# Patient Record
Sex: Male | Born: 1975
Health system: Southern US, Community
[De-identification: ages and names within clinical notes are randomized; demographics above are authoritative.]

## PROBLEM LIST (undated history)

## (undated) DIAGNOSIS — F32A Depression, unspecified: Secondary | ICD-10-CM

## (undated) DIAGNOSIS — N289 Disorder of kidney and ureter, unspecified: Secondary | ICD-10-CM

## (undated) DIAGNOSIS — K4090 Unilateral inguinal hernia, without obstruction or gangrene, not specified as recurrent: Secondary | ICD-10-CM

## (undated) DIAGNOSIS — Z973 Presence of spectacles and contact lenses: Secondary | ICD-10-CM

## (undated) DIAGNOSIS — N200 Calculus of kidney: Secondary | ICD-10-CM

## (undated) HISTORY — DX: Unilateral inguinal hernia, without obstruction or gangrene, not specified as recurrent: K40.90

## (undated) HISTORY — PX: WISDOM TOOTH EXTRACTION: SHX21

---

## 2003-05-01 ENCOUNTER — Emergency Department (HOSPITAL_COMMUNITY): Admission: EM | Admit: 2003-05-01 | Discharge: 2003-05-01 | Payer: Self-pay | Admitting: Family Medicine

## 2003-11-26 ENCOUNTER — Emergency Department (HOSPITAL_COMMUNITY): Admission: EM | Admit: 2003-11-26 | Discharge: 2003-11-26 | Payer: Self-pay | Admitting: *Deleted

## 2004-07-06 ENCOUNTER — Emergency Department (HOSPITAL_COMMUNITY): Admission: EM | Admit: 2004-07-06 | Discharge: 2004-07-06 | Payer: Self-pay | Admitting: Family Medicine

## 2008-11-02 ENCOUNTER — Encounter: Admission: RE | Admit: 2008-11-02 | Discharge: 2008-11-02 | Payer: Self-pay | Admitting: Internal Medicine

## 2010-12-30 ENCOUNTER — Telehealth: Payer: Self-pay | Admitting: Internal Medicine

## 2010-12-30 NOTE — Telephone Encounter (Signed)
Pt woke up this morning knot on the right leg pelvic area  Tinder to touch  Has appointment wed 11/14 @ 3:15 Dr walker diagnosed him with an equinal hernia in feb of this year

## 2010-12-30 NOTE — Telephone Encounter (Signed)
Left mess to call office back.  X 2

## 2011-01-02 ENCOUNTER — Ambulatory Visit: Payer: Self-pay | Admitting: Internal Medicine

## 2011-01-03 NOTE — Telephone Encounter (Signed)
Left pt detailed VM that he should keep apt tomorrow and if symptoms were severe or he had any questions to call office asap.

## 2011-01-04 ENCOUNTER — Encounter: Payer: Self-pay | Admitting: Internal Medicine

## 2011-01-04 ENCOUNTER — Other Ambulatory Visit: Payer: Self-pay | Admitting: Internal Medicine

## 2011-01-04 ENCOUNTER — Ambulatory Visit (INDEPENDENT_AMBULATORY_CARE_PROVIDER_SITE_OTHER): Payer: 59 | Admitting: Internal Medicine

## 2011-01-04 VITALS — BP 108/68 | HR 71 | Temp 98.2°F | Wt 169.0 lb

## 2011-01-04 DIAGNOSIS — L0291 Cutaneous abscess, unspecified: Secondary | ICD-10-CM

## 2011-01-04 DIAGNOSIS — K4090 Unilateral inguinal hernia, without obstruction or gangrene, not specified as recurrent: Secondary | ICD-10-CM

## 2011-01-04 DIAGNOSIS — L0390 Cellulitis, unspecified: Secondary | ICD-10-CM

## 2011-01-04 DIAGNOSIS — Z23 Encounter for immunization: Secondary | ICD-10-CM

## 2011-01-04 MED ORDER — SULFAMETHOXAZOLE-TMP DS 800-160 MG PO TABS: 1.0000 | ORAL_TABLET | Freq: Two times a day (BID) | ORAL | Status: AC

## 2011-01-04 NOTE — Progress Notes (Signed)
  Subjective:    Patient ID: Andrew Vazquez, male    DOB: 1975/03/13, 35 y.o.   MRN: [id number]  HPI 34YO male presents for acute visit c/o bulging right mass inguinal area noted about 1 week ago. At same time, noted abscess in right medial upper thigh. Has had similar abscesses in past, associated with ingrown hair.  Drained mass using tweezers and scizors at home.  Reports drainage of purulent material and blood. Persistent pain at site after 1 week. Denies fever, chills.  No other skin lesions.  Also notes left sided bulging area in groin which we had evaluated several months ago and diagnosed as hernia.  Has not yet seen surgeon for this. No pain at site. Notes some intermittent swelling and change in size of both testicles. No testicular masses noted.    Review of Systems  Constitutional: Negative for fever, chills, activity change, appetite change, fatigue and unexpected weight change.  Eyes: Negative for visual disturbance.  Respiratory: Negative for cough and shortness of breath.   Cardiovascular: Negative for chest pain, palpitations and leg swelling.  Gastrointestinal: Negative for abdominal pain and abdominal distention.  Genitourinary: Negative for dysuria, urgency and difficulty urinating.  Musculoskeletal: Negative for arthralgias and gait problem.  Skin: Positive for wound. Negative for color change and rash.  Hematological: Positive for adenopathy.   BP 108/68  Pulse 71  Temp(Src) 98.2 F (36.8 C) (Oral)  Wt 169 lb (76.658 kg)  SpO2 98%     Objective:   Physical Exam  Constitutional: He is oriented to person, place, and time. He appears well-developed and well-nourished. No distress.  HENT:  Head: Normocephalic and atraumatic.  Right Ear: External ear normal.  Left Ear: External ear normal.  Nose: Nose normal.  Mouth/Throat: Oropharynx is clear and moist. No oropharyngeal exudate.  Eyes: Conjunctivae and EOM are normal. Pupils are equal, round, and reactive to  light. No scleral icterus.  Neck: Normal range of motion. Neck supple. No thyromegaly present.  Pulmonary/Chest: Effort normal.  Abdominal: A hernia is present. Hernia confirmed positive in the left inguinal area.  Genitourinary:     Musculoskeletal: Normal range of motion. He exhibits no edema.  Lymphadenopathy:       Right: Inguinal adenopathy present.  Neurological: He is alert and oriented to person, place, and time. No cranial nerve deficit. Coordination normal.  Skin: Skin is warm and dry. No rash noted. He is not diaphoretic. There is erythema. No pallor.  Psychiatric: He has a normal mood and affect. His behavior is normal. Judgment and thought content normal.          Assessment & Plan:  1. Left inguinal hernia - Persistent. Recommended surgical evaluation for repair. Will set up with Dr. Michela Pitcher.  2. Right inguinal adenopathy - Suspect related to abscess/folliculitis. Will treat with bactrim and monitor for resolution.  3. Right anterior upper thigh abscess - Open and draining. Culture sent from wound today. Will start empiric Bactrim. Follow up if not resolving by end of week.

## 2011-01-07 LAB — CULTURE, ROUTINE-ABSCESS
Gram Stain: NONE SEEN
Gram Stain: NONE SEEN
Gram Stain: NONE SEEN
Organism ID, Bacteria: NO GROWTH

## 2011-01-20 ENCOUNTER — Ambulatory Visit (INDEPENDENT_AMBULATORY_CARE_PROVIDER_SITE_OTHER): Payer: 59 | Admitting: Surgery

## 2011-01-25 ENCOUNTER — Encounter: Payer: Self-pay | Admitting: Internal Medicine

## 2011-02-08 ENCOUNTER — Ambulatory Visit: Payer: Self-pay | Admitting: Surgery

## 2011-02-08 HISTORY — PX: LAPAROSCOPIC INGUINAL HERNIA REPAIR: SUR788

## 2011-04-10 ENCOUNTER — Ambulatory Visit (INDEPENDENT_AMBULATORY_CARE_PROVIDER_SITE_OTHER): Payer: 59 | Admitting: Internal Medicine

## 2011-04-10 ENCOUNTER — Encounter: Payer: Self-pay | Admitting: Internal Medicine

## 2011-04-10 DIAGNOSIS — K4090 Unilateral inguinal hernia, without obstruction or gangrene, not specified as recurrent: Secondary | ICD-10-CM | POA: Insufficient documentation

## 2011-04-10 DIAGNOSIS — Z720 Tobacco use: Secondary | ICD-10-CM

## 2011-04-10 DIAGNOSIS — F17200 Nicotine dependence, unspecified, uncomplicated: Secondary | ICD-10-CM

## 2011-04-10 NOTE — Assessment & Plan Note (Signed)
Strongly encourage smoking cessation. We discussed techniques to help with smoking cessation including the use of Chantix or Wellbutrin. Patient will continue to think about this he will call or e-mail if he is interested in starting one of these medications.

## 2011-04-10 NOTE — Progress Notes (Signed)
  Subjective:    Patient ID: Andrew Vazquez, male    DOB: [birth year], 36 y.o.   MRN: [id number]  HPI 36 year old male status post left inguinal hernia repair presents for followup. He reports he tolerated surgery well. He denies any complications after surgery. He denies any recurrent abdominal pain except for mild pain which he attributes to scar tissue and mesh. He denies fever or chills. He has been cleared by his surgeon to return to work.   No outpatient encounter prescriptions on file as of 04/10/2011.    Review of Systems  Constitutional: Negative for fever, chills, activity change, appetite change, fatigue and unexpected weight change.  Eyes: Negative for visual disturbance.  Respiratory: Negative for cough and shortness of breath.   Cardiovascular: Negative for chest pain, palpitations and leg swelling.  Gastrointestinal: Negative for abdominal pain and abdominal distention.  Genitourinary: Negative for dysuria, urgency and difficulty urinating.  Musculoskeletal: Negative for arthralgias and gait problem.  Skin: Negative for color change and rash.  Hematological: Negative for adenopathy.  Psychiatric/Behavioral: Negative for sleep disturbance and dysphoric mood. The patient is not nervous/anxious.    BP 110/66  Pulse 81  Temp(Src) 98.3 F (36.8 C) (Oral)  Ht 5\' 10"  (1.778 m)  Wt 172 lb (78.019 kg)  BMI 24.68 kg/m2  SpO2 99%     Objective:   Physical Exam  Constitutional: He is oriented to person, place, and time. He appears well-developed and well-nourished. No distress.  HENT:  Head: Normocephalic and atraumatic.  Right Ear: External ear normal.  Left Ear: External ear normal.  Nose: Nose normal.  Mouth/Throat: Oropharynx is clear and moist. No oropharyngeal exudate.  Eyes: Conjunctivae and EOM are normal. Pupils are equal, round, and reactive to light. Right eye exhibits no discharge. Left eye exhibits no discharge. No scleral icterus.  Neck: Normal range of  motion. Neck supple. No tracheal deviation present. No thyromegaly present.  Cardiovascular: Normal rate, regular rhythm and normal heart sounds.  Exam reveals no gallop and no friction rub.   No murmur heard. Pulmonary/Chest: Effort normal and breath sounds normal. No respiratory distress. He has no wheezes. He has no rales. He exhibits no tenderness.  Abdominal: Soft. Bowel sounds are normal. He exhibits no distension and no mass. There is no tenderness. There is no rebound and no guarding.    Musculoskeletal: Normal range of motion. He exhibits no edema.  Lymphadenopathy:    He has no cervical adenopathy.  Neurological: He is alert and oriented to person, place, and time. No cranial nerve deficit. Coordination normal.  Skin: Skin is warm and dry. No rash noted. He is not diaphoretic. No erythema. No pallor.  Psychiatric: He has a normal mood and affect. His behavior is normal. Judgment and thought content normal.          Assessment & Plan:

## 2011-04-10 NOTE — Assessment & Plan Note (Signed)
Status post hernia repair. Surgical site is well-healed. Patient has been released by surgeon to go back to work. 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

## 2011-08-02 ENCOUNTER — Telehealth: Payer: Self-pay | Admitting: Internal Medicine

## 2011-08-02 NOTE — Telephone Encounter (Signed)
EMERGENT:  Caller: Andrew Vazquez/Patient; PCP: Ronna Polio; CB#: [phone];  Call regarding Pain Above Hernia Surgery 6 months ago.;  Intermittent pain, especially if moves or coughs, is located L abdomen at level of umbilicus. Pain rated 6 out of 10. Onset: 07/31/11.  Afebrile. Started out as generalized abdominal pain, now worse on L side.   Last BM 08/01/11.  No vomiting or nausea.  At work. Advised to see MD now for generalized abdominal pain that progressed to localized pain and unable to carry out normal activities per Abdominal Pain Guideline. No appts remain in office; Info noted and sent to MD.  Please call pt at above work #.

## 2011-08-02 NOTE — Telephone Encounter (Signed)
Patient advised as instructed via telephone, he will go to [facility] since he is in [facility] now.

## 2011-08-02 NOTE — Telephone Encounter (Signed)
Will need to go to ED as may have obstruction of bowel given recent hernia surgery.

## 2011-08-02 NOTE — Telephone Encounter (Signed)
Left message on cell phone voicemail and on voicemail at work for patient to return call.

## 2011-08-03 ENCOUNTER — Ambulatory Visit (INDEPENDENT_AMBULATORY_CARE_PROVIDER_SITE_OTHER): Payer: 59 | Admitting: Internal Medicine

## 2011-08-03 ENCOUNTER — Encounter: Payer: Self-pay | Admitting: Internal Medicine

## 2011-08-03 VITALS — BP 110/70 | HR 70 | Temp 98.7°F | Ht 70.0 in | Wt 165.5 lb

## 2011-08-03 DIAGNOSIS — H6690 Otitis media, unspecified, unspecified ear: Secondary | ICD-10-CM

## 2011-08-03 DIAGNOSIS — R1032 Left lower quadrant pain: Secondary | ICD-10-CM

## 2011-08-03 DIAGNOSIS — H6691 Otitis media, unspecified, right ear: Secondary | ICD-10-CM | POA: Insufficient documentation

## 2011-08-03 MED ORDER — ANTIPYRINE-BENZOCAINE 5.4-1.4 % OT SOLN: 3.0000 [drp] | OTIC | Status: AC | PRN

## 2011-08-03 MED ORDER — LEVOFLOXACIN 750 MG PO TABS: 750.0000 mg | ORAL_TABLET | Freq: Every day | ORAL | Status: AC

## 2011-08-03 NOTE — Assessment & Plan Note (Signed)
Symptoms have resolved. Suspect secondary to muscular strain. Will continue to monitor. He will call if symptoms recur.

## 2011-08-03 NOTE — Progress Notes (Signed)
Subjective:    Patient ID: Andrew Vazquez, male    DOB: December 17, 1975, 36 y.o.   MRN: [id number]  HPI 36 year old male presents for acute visit complaining of intermittent episodes of left lower quadrant abdominal pain yesterday. This has now resolved. During that time, he did not have any nausea, vomiting, diarrhea, fever, chills. He notes that he has been increasing his physical activity and doing abdominal exercises. Pain is completely resolved without any intervention.  He is also concerned today about sudden onset of right ear pain this morning. He notes he has had an upper respiratory infection with sinus drainage, and occasional cough over the last couple of weeks. He denies any fever or chills. He notes that his son is ill with similar symptoms.  Outpatient Encounter Prescriptions as of 08/03/2011  Medication Sig Dispense Refill  . antipyrine-benzocaine (AURALGAN) otic solution Place 3 drops into the right ear every 2 (two) hours as needed for pain.  10 mL  0  . levofloxacin (LEVAQUIN) 750 MG tablet Take 1 tablet (750 mg total) by mouth daily.  7 tablet  0    Review of Systems  Constitutional: Negative for fever, chills, fatigue and unexpected weight change.  HENT: Positive for ear pain, congestion, rhinorrhea, postnasal drip and sinus pressure. Negative for hearing loss, nosebleeds, sore throat, facial swelling, sneezing, drooling, mouth sores, trouble swallowing, neck pain, neck stiffness, dental problem, voice change, tinnitus and ear discharge.   Eyes: Negative for photophobia, pain, discharge, redness, itching and visual disturbance.  Respiratory: Positive for cough. Negative for apnea, choking, chest tightness, shortness of breath and wheezing.   Cardiovascular: Negative for chest pain, palpitations and leg swelling.  Gastrointestinal: Positive for abdominal pain. Negative for nausea, vomiting, diarrhea, constipation, blood in stool, abdominal distention, anal bleeding and rectal  pain.  Genitourinary: Negative for dysuria, frequency, hematuria, flank pain, decreased urine volume, scrotal swelling, difficulty urinating and testicular pain.  Musculoskeletal: Negative for myalgias, back pain and arthralgias.  Skin: Negative for color change, rash and wound.   BP 110/70  Pulse 70  Temp 98.7 F (37.1 C) (Oral)  Ht 5\' 10"  (1.778 m)  Wt 165 lb 8 oz (75.07 kg)  BMI 23.75 kg/m2  SpO2 97%     Objective:   Physical Exam  Constitutional: He is oriented to person, place, and time. He appears well-developed and well-nourished. No distress.  HENT:  Head: Normocephalic and atraumatic.  Right Ear: External ear normal. Tympanic membrane is injected, erythematous and bulging. A middle ear effusion is present.  Left Ear: Tympanic membrane and external ear normal.  Nose: Nose normal.  Mouth/Throat: Oropharynx is clear and moist. No oropharyngeal exudate.  Eyes: Conjunctivae and EOM are normal. Pupils are equal, round, and reactive to light. Right eye exhibits no discharge. Left eye exhibits no discharge. No scleral icterus.  Neck: Normal range of motion. Neck supple. No tracheal deviation present. No thyromegaly present.  Cardiovascular: Normal rate, regular rhythm and normal heart sounds.  Exam reveals no gallop and no friction rub.   No murmur heard. Pulmonary/Chest: Effort normal and breath sounds normal. No respiratory distress. He has no wheezes. He has no rales. He exhibits no tenderness.  Abdominal: Soft. Bowel sounds are normal. He exhibits no distension and no mass. There is no tenderness. There is no rebound and no guarding.  Musculoskeletal: Normal range of motion. He exhibits no edema.  Lymphadenopathy:    He has no cervical adenopathy.  Neurological: He is alert and oriented to person, place,  and time. No cranial nerve deficit. Coordination normal.  Skin: Skin is warm and dry. No rash noted. He is not diaphoretic. No erythema. No pallor.  Psychiatric: He has a  normal mood and affect. His behavior is normal. Judgment and thought content normal.          Assessment & Plan:

## 2011-08-03 NOTE — Assessment & Plan Note (Signed)
Exam consistent with right otitis media. Will treat with Levaquin and use Auralgan and ibuprofen as needed for pain. Patient will call if symptoms are not improving over the next 24 hours. He will followup in one month for recheck.

## 2011-08-31 ENCOUNTER — Ambulatory Visit: Payer: 59 | Admitting: Internal Medicine

## 2012-03-15 ENCOUNTER — Telehealth: Payer: Self-pay | Admitting: Internal Medicine

## 2012-03-15 NOTE — Telephone Encounter (Signed)
Agree. Most likely viral infection, but can be seen Saturday at [facility].

## 2012-03-15 NOTE — Telephone Encounter (Signed)
Pt called and message left notifying him of this information.

## 2012-03-15 NOTE — Telephone Encounter (Signed)
Forward to Dr. Walker 

## 2012-03-15 NOTE — Telephone Encounter (Signed)
Patient Information:  Caller Name: Andrew Vazquez  Phone: [phone]  Patient: Andrew Vazquez, Andrew Vazquez  Gender: Male  DOB: [birth year]  Age: 37 Years  PCP: Ronna Polio (Adults only)  Office Follow Up:  Does the office need to follow up with this patient?: No  Instructions For The Office: N/A   Symptoms  Reason For Call & Symptoms: Started with a sore throat on 1/21 and worsened.  Has a fever today; aching all over.  Reviewed Health History In EMR: Yes  Reviewed Medications In EMR: Yes  Reviewed Allergies In EMR: Yes  Reviewed Surgeries / Procedures: Yes  Date of Onset of Symptoms: 03/13/2012  Treatments Tried: Tylenol Sore Throat/Cold sx  Treatments Tried Worked: No  Guideline(s) Used:  Influenza - Seasonal  Sore Throat  Disposition Per Guideline:   See Today in Office  Reason For Disposition Reached:   Severe sore throat pain  Advice Given:  N/A  No appointments available today.  He refused to go to an UC.  RN will schedule an appointment at [facility] for Saturday when the appointments are opened.

## 2012-03-16 ENCOUNTER — Encounter: Payer: Self-pay | Admitting: Internal Medicine

## 2012-03-16 ENCOUNTER — Ambulatory Visit (INDEPENDENT_AMBULATORY_CARE_PROVIDER_SITE_OTHER): Payer: 59 | Admitting: Internal Medicine

## 2012-03-16 VITALS — BP 100/62 | Temp 97.6°F | Wt 168.0 lb

## 2012-03-16 DIAGNOSIS — J020 Streptococcal pharyngitis: Secondary | ICD-10-CM

## 2012-03-16 LAB — POCT RAPID STREP A (OFFICE): Rapid Strep A Screen: NEGATIVE

## 2012-03-16 MED ORDER — PHENYLEPH-PROMETHAZINE-COD 5-6.25-10 MG/5ML PO SYRP: 5.0000 mL | ORAL_SOLUTION | Freq: Three times a day (TID) | ORAL | Status: DC | PRN

## 2012-03-16 MED ORDER — LEVOFLOXACIN 500 MG PO TABS: 500.0000 mg | ORAL_TABLET | Freq: Every day | ORAL | Status: DC

## 2012-03-16 NOTE — Progress Notes (Signed)
HPI  Pt presents to the clinic today with c/o sore throat and cough x 4 days. The worst part is the sore throat and dry cough. He does not produce any sputum. He was running fevers yesterday. He has tried Delsym and cough drops and nothing seems to help. The cough is worse at night. He has not had much sleep in 3 nights. He does have sick contacts.  Review of Systems      Past Medical History  Diagnosis Date  . Hernia, inguinal, unilateral     Family History  Problem Relation Age of Onset  . Cancer Father 72    Colon    History   Social History  . Marital Status: Married    Spouse Name: N/A    Number of Children: N/A  . Years of Education: N/A   Occupational History  . Not on file.   Social History Main Topics  . Smoking status: Current Every Day Smoker -- 1.0 packs/day  . Smokeless tobacco: Not on file  . Alcohol Use: Not on file  . Drug Use: Not on file  . Sexually Active: Not on file   Other Topics Concern  . Not on file   Social History Narrative  . No narrative on file    Allergies  Allergen Reactions  . Penicillins Rash     Constitutional: Positive headache, fatigue and fever. Denies abrupt weight changes.  HEENT:  Positive sore throat. Denies eye redness, eye pain, pressure behind the eyes, facial pain, nasal congestion, ear pain, ringing in the ears, wax buildup, runny nose or bloody nose. Respiratory: Positive cough. Denies difficulty breathing or shortness of breath.  Cardiovascular: Denies chest pain, chest tightness, palpitations or swelling in the hands or feet.   No other specific complaints in a complete review of systems (except as listed in HPI above).  Objective:   BP 100/62  Temp 97.6 F (36.4 C) (Oral)  Wt 168 lb (76.204 kg) Wt Readings from Last 3 Encounters:  03/16/12 168 lb (76.204 kg)  08/03/11 165 lb 8 oz (75.07 kg)  04/10/11 172 lb (78.019 kg)     General: Appears his stated age, well developed, well nourished in  NAD. HEENT: Head: normal shape and size; Eyes: sclera white, no icterus, conjunctiva pink, PERRLA and EOMs intact; Ears: Tm's gray and intact, normal light reflex; Nose: mucosa pink and moist, septum midline; Throat/Mouth: + PND. Teeth present, mucosa erythematous and moist, moderate exudate noted on bilateral tonsillar pillars, no lesions or ulcerations noted.  Neck: Moderate tonsiallar lymphadenopathy. Neck supple, trachea midline. No massses, lumps or thyromegaly present.  Cardiovascular: Normal rate and rhythm. S1,S2 noted.  No murmur, rubs or gallops noted. No JVD or BLE edema. No carotid bruits noted. Pulmonary/Chest: Normal effort and positive vesicular breath sounds. No respiratory distress. No wheezes, rales or ronchi noted.      Assessment & Plan:   Strep Pharyngitis, new onset with additional workup required:  Rapid strep test  Get some rest and drink plenty of water Do salt water gargles for the sore throat eRx for Levaquin x 7 days eRx for promethazine-codeine cough syrup  RTC as needed or if symptoms persist.

## 2012-03-16 NOTE — Patient Instructions (Signed)

## 2012-03-18 ENCOUNTER — Telehealth: Payer: Self-pay | Admitting: Internal Medicine

## 2012-03-18 NOTE — Telephone Encounter (Signed)
[facility] Triage Call Report Triage Record Num: [id number] Operator: Hyman Bower Patient Name: Andrew Vazquez Call Date & Time: 03/16/2012 1:47:11PM Patient Phone: [phone] PCP: Ronna Polio Patient Gender: Male PCP Fax : [phone] Patient DOB: [birth year] Practice Name: [facility] Reason for Call: Caller: [name]/Patient; PCP: Other; CB#: [phone]; Patient was seen by Nicki Reaper this morning and diagnosed with strep throat. Patient was able to get antibiotic filled, but is unable to get Phenyleph-Promethazine-Cod 5-6.25-10mg /43ml filled. Patient has tried to get this filled at several different pharmacies. Pharmacy tells the patient because the letters VC are on the prescription. Seems like this medication is not available. Called and spoke with Dr. Ermalene Searing, she wants to change the order to Guaifenesin with Codeine syrup 5/100 5-10mg  PO HS prn coughing. Called this into [facility] on [address]. Advised patient this was being changed from the previous prescription he was unable to get filled. Patient verbalized understanding. Protocol(s) Used: Office Note Recommended Outcome per Protocol: Information Noted and Sent to Office Reason for Outcome: Caller information to office

## 2012-03-18 NOTE — Telephone Encounter (Signed)
Patient seen 03/16/12 @ Saturday clinic

## 2012-03-22 ENCOUNTER — Telehealth: Payer: Self-pay | Admitting: Internal Medicine

## 2012-03-22 NOTE — Telephone Encounter (Signed)
Pt was scheduled for 03/26/12 but called and r/s to 04/02/12 because he needed an early appointment because of his work schedule and driving to [place].

## 2012-03-22 NOTE — Telephone Encounter (Signed)
Pt is calling concerning strep throat and he couldn't ever get the rx filled that the physician at [facility] had written out. He is asking that a nurse or Dr. Dan Humphreys call him if possible. He has had a bad reaction to strep medication and isn't sure what he should do.

## 2012-03-22 NOTE — Telephone Encounter (Signed)
Spoke with pt. Completed Levaquin. Now having sandpaper-like red rash over body. Description seems most consistent with strep infection. No recurrent fever, throat pain or other symptoms. Pt will email with picture of rash, as I am out of office later today. Will plan to use Benadryl 25-50mg  po q8hr prn itching and monitor rash for now. If symptoms worsening, will need to be seen over the weekend.

## 2012-03-25 ENCOUNTER — Telehealth: Payer: Self-pay | Admitting: Internal Medicine

## 2012-03-25 NOTE — Telephone Encounter (Signed)
Patient offered appointment today and he declined. Patient scheduled to be seen tomorrow (Tuesday0.

## 2012-03-25 NOTE — Telephone Encounter (Signed)
This pt is trying to get in for rash. Can we put him in with Raquel today?

## 2012-03-26 ENCOUNTER — Ambulatory Visit: Payer: 59 | Admitting: Internal Medicine

## 2012-03-26 ENCOUNTER — Encounter: Payer: Self-pay | Admitting: Internal Medicine

## 2012-03-26 ENCOUNTER — Ambulatory Visit (INDEPENDENT_AMBULATORY_CARE_PROVIDER_SITE_OTHER): Payer: 59 | Admitting: Internal Medicine

## 2012-03-26 VITALS — BP 112/62 | HR 67 | Temp 97.8°F | Ht 71.0 in | Wt 169.0 lb

## 2012-03-26 DIAGNOSIS — J020 Streptococcal pharyngitis: Secondary | ICD-10-CM

## 2012-03-26 DIAGNOSIS — A389 Scarlet fever, uncomplicated: Secondary | ICD-10-CM

## 2012-03-26 DIAGNOSIS — A388 Scarlet fever with other complications: Secondary | ICD-10-CM | POA: Insufficient documentation

## 2012-03-26 MED ORDER — PREDNISONE (PAK) 10 MG PO TABS: ORAL_TABLET | ORAL | Status: DC

## 2012-03-26 MED ORDER — CEFDINIR 300 MG PO CAPS: 300.0000 mg | ORAL_CAPSULE | Freq: Two times a day (BID) | ORAL | Status: DC

## 2012-03-26 NOTE — Assessment & Plan Note (Signed)
Symptoms of sore throat persistent after treatment with levaquin. Now with diffuse rash consistent with scarlet fever. Will send throat culture. Will treat with Cefdinir and Prednisone taper. Pt will call if recurrent fever, if rash worsening. Follow up 1 week.

## 2012-03-26 NOTE — Progress Notes (Signed)
  Subjective:    Patient ID: Andrew Vazquez, male    DOB: 09-Apr-1975, 37 y.o.   MRN: [id number]  HPI 37YO male presents for acute visit c/o rash x 5 days. Pt was seen in clinic 2 weeks ago for sore throat, diagnosed with strep throat (rapid test neg) and treated with Levaquin. His sore throat improved but did not completely resolve. Over the course of last week, he developed red, bumpy rash over his trunk and extremities. He denies any fever, chills. Notes mild sore throat. He notes that several times in the past, he has had this kind of rash after strep throat.  Outpatient Encounter Prescriptions as of 03/26/2012  Medication Sig Dispense Refill  . cefdinir (OMNICEF) 300 MG capsule Take 1 capsule (300 mg total) by mouth 2 (two) times daily.  20 capsule  0  . predniSONE (STERAPRED UNI-PAK) 10 MG tablet Take 60mg  day 1 then taper by 10mg  daily  21 tablet  0    Review of Systems  Constitutional: Negative for fever, chills, activity change and fatigue.  HENT: Positive for sore throat. Negative for hearing loss, ear pain, nosebleeds, congestion, rhinorrhea, sneezing, trouble swallowing, neck pain, neck stiffness, voice change, postnasal drip, sinus pressure, tinnitus and ear discharge.   Eyes: Negative for discharge, redness, itching and visual disturbance.  Respiratory: Negative for cough, chest tightness, shortness of breath, wheezing and stridor.   Cardiovascular: Negative for chest pain and leg swelling.  Musculoskeletal: Negative for myalgias and arthralgias.  Skin: Positive for color change and rash.  Neurological: Negative for dizziness, facial asymmetry and headaches.  Psychiatric/Behavioral: Negative for sleep disturbance.       Objective:   Physical Exam  Constitutional: He is oriented to person, place, and time. He appears well-developed and well-nourished. No distress.  HENT:  Head: Normocephalic and atraumatic.  Right Ear: External ear normal.  Left Ear: External ear normal.   Nose: Nose normal.  Mouth/Throat: Posterior oropharyngeal erythema present. No oropharyngeal exudate.  Eyes: Conjunctivae normal and EOM are normal. Pupils are equal, round, and reactive to light. Right eye exhibits no discharge. Left eye exhibits no discharge. No scleral icterus.  Neck: Normal range of motion. Neck supple. No tracheal deviation present. No thyromegaly present.  Cardiovascular: Normal rate, regular rhythm and normal heart sounds.  Exam reveals no gallop and no friction rub.   No murmur heard. Pulmonary/Chest: Effort normal and breath sounds normal. No respiratory distress. He has no wheezes. He has no rales. He exhibits no tenderness.  Musculoskeletal: Normal range of motion. He exhibits no edema.  Lymphadenopathy:    He has no cervical adenopathy.  Neurological: He is alert and oriented to person, place, and time. No cranial nerve deficit. Coordination normal.  Skin: Skin is warm and dry. Rash (over trunk and extremities) noted. Rash is maculopapular. He is not diaphoretic. There is erythema. No pallor.  Psychiatric: He has a normal mood and affect. His behavior is normal. Judgment and thought content normal.          Assessment & Plan:

## 2012-03-27 ENCOUNTER — Ambulatory Visit: Payer: 59 | Admitting: Internal Medicine

## 2012-03-28 LAB — CULTURE, GROUP A STREP

## 2012-03-29 ENCOUNTER — Telehealth: Payer: Self-pay | Admitting: Internal Medicine

## 2012-03-29 NOTE — Telephone Encounter (Signed)
Spoke with patient and offered several appts with Dr.Walker and he declined them all because of his work schdule, pt scheduled with Raquel on Tuesday @ 8am

## 2012-03-29 NOTE — Telephone Encounter (Signed)
Pt calling to get instructions for MyChart.  Pt also wants to let Dr. Dan Humphreys know that the new Rx and steroids on received Tuesday have not helped and the rash is no better.  Please advise.

## 2012-03-29 NOTE — Telephone Encounter (Signed)
The instructions for MyChart are in the packet I gave him. If rash no better, he should be seen.

## 2012-03-29 NOTE — Telephone Encounter (Signed)
We could offer him an appointment with another provider? Or at [facility]? He was treated appropriately for the strep infection. The rash may persist. We could also set up an appointment with ENT, as he may need eval for repeated strep infections/colonization

## 2012-03-29 NOTE — Telephone Encounter (Signed)
Spoke with patient who was irritated, pt states this rash is no better and there is a problem. I tried scheduling pt today at 1:45 but pt stated he works in [place] and can't come then wanted something as late as possible. i advised pt you where full and tried to schedule on Monday which there are 2 openings and again this doesn't;t work for his schedule, he wants "real early or real late" pt states he can't just take off whenever the Dr. Luciana Axe. Please advise

## 2012-03-31 ENCOUNTER — Encounter: Payer: Self-pay | Admitting: Internal Medicine

## 2012-04-02 ENCOUNTER — Ambulatory Visit: Payer: 59 | Admitting: Internal Medicine

## 2012-04-02 ENCOUNTER — Ambulatory Visit: Payer: 59 | Admitting: Adult Health

## 2012-04-06 ENCOUNTER — Other Ambulatory Visit: Payer: Self-pay

## 2012-08-15 ENCOUNTER — Encounter: Payer: Self-pay | Admitting: Adult Health

## 2012-08-15 ENCOUNTER — Ambulatory Visit (INDEPENDENT_AMBULATORY_CARE_PROVIDER_SITE_OTHER): Payer: 59 | Admitting: Adult Health

## 2012-08-15 VITALS — BP 106/60 | HR 67 | Temp 98.3°F | Resp 12 | Wt 175.0 lb

## 2012-08-15 DIAGNOSIS — R319 Hematuria, unspecified: Secondary | ICD-10-CM

## 2012-08-15 LAB — POCT URINALYSIS DIPSTICK
Glucose, UA: NEGATIVE
Leukocytes, UA: NEGATIVE
Spec Grav, UA: 1.025
Urobilinogen, UA: 0.2
pH, UA: 6

## 2012-08-15 MED ORDER — LEVOFLOXACIN 500 MG PO TABS: 500.0000 mg | ORAL_TABLET | Freq: Every day | ORAL | Status: DC

## 2012-08-15 NOTE — Assessment & Plan Note (Addendum)
Hematuria x2 events. UTI vs. nephrolithiasis vs. Prostatitis. UA dipstick shows blood, negative nitrites or leukocytes. Send urine for culture. Start Levaquin 500 mg daily x10 days. Push fluids, mainly water. Avoid bladder irritants such as alcohol, tea and colas. If cultures negative will do additional workup; consider referral to urology.

## 2012-08-15 NOTE — Progress Notes (Signed)
  Subjective:    Patient ID: Andrew Vazquez, male    DOB: 1975-05-01, 37 y.o.   MRN: [id number]  HPI  Patient is a healthy 37 year old male who presents to clinic with hematuria x 2 episodes. First time was Tuesday evening and then again Wednesday morning. He has not seen any more blood since. He experienced mild lower back pain which has subsided. Patient denies fever, chills, suprapubic tenderness, dysuria. Patient reports working out regularly. He denies taking any supplements. Patient is married. He is sexually active and reports a monogamous relationship.   Past Medical History  Diagnosis Date  . Hernia, inguinal, unilateral     Family History  Problem Relation Age of Onset  . Cancer Father 19    Colon    History   Social History  . Marital Status: Married    Spouse Name: N/A    Number of Children: N/A  . Years of Education: N/A   Occupational History  . Not on file.   Social History Main Topics  . Smoking status: Current Every Day Smoker -- 1.00 packs/day  . Smokeless tobacco: Not on file  . Alcohol Use: Not on file  . Drug Use: Not on file  . Sexually Active: Not on file   Other Topics Concern  . Not on file   Social History Narrative  . No narrative on file    Review of Systems  Endocrine: Negative.   Genitourinary: Positive for hematuria and flank pain. Negative for dysuria, urgency, frequency, discharge, scrotal swelling, difficulty urinating and testicular pain.  Musculoskeletal: Positive for back pain.  Skin: Negative.   Allergic/Immunologic: Negative.   Neurological: Negative.   Hematological: Negative.   Psychiatric/Behavioral: Negative.   All other systems reviewed and are negative.    BP 106/60  Pulse 67  Temp(Src) 98.3 F (36.8 C) (Oral)  Resp 12  Wt 175 lb (79.379 kg)  BMI 24.42 kg/m2  SpO2 98%    Objective:   Physical Exam  Constitutional: He is oriented to person, place, and time. He appears well-developed and well-nourished. No  distress.  Cardiovascular: Normal rate and regular rhythm.   Pulmonary/Chest: Effort normal. No respiratory distress.  Abdominal: Soft. He exhibits no distension and no mass. There is no tenderness. There is no rebound and no guarding.  Neurological: He is alert and oriented to person, place, and time.  Skin: Skin is warm and dry.  Psychiatric: He has a normal mood and affect. His behavior is normal. Judgment and thought content normal.       Assessment & Plan:

## 2012-08-22 ENCOUNTER — Telehealth: Payer: Self-pay | Admitting: Internal Medicine

## 2012-08-22 NOTE — Telephone Encounter (Signed)
I put the order in. Can you call to add?

## 2012-08-22 NOTE — Telephone Encounter (Signed)
Left message for pt with advice. 

## 2012-08-22 NOTE — Telephone Encounter (Signed)
If he develops a fever, chills or unrelieved pain he needs to go to the ED.

## 2012-08-22 NOTE — Telephone Encounter (Signed)
I have not received his culture back ??

## 2012-08-22 NOTE — Telephone Encounter (Signed)
You never send me a message to add a culture, the only times I automatically send a urine for culture is when there is leukocytes in the urine

## 2012-08-22 NOTE — Telephone Encounter (Signed)
Cant be added, past the 24 hours

## 2012-08-22 NOTE — Telephone Encounter (Signed)
Please tell patient to return to clinic for a repeat urinalysis after he has completed the antibiotic. We will check if there is still blood. There was a lab mixup and his urine was not sent for culture.

## 2012-08-22 NOTE — Telephone Encounter (Signed)
Spoke with pt, was woken up last night with sharp right flank pain, pain has subsided to just tender to touch. He has taken Ibuprofen and drinking lots of fluids if possible kidney stone. Any other advice for him?

## 2012-08-22 NOTE — Telephone Encounter (Signed)
Patient wants his lab results from his urine.

## 2012-09-05 ENCOUNTER — Other Ambulatory Visit: Payer: Self-pay | Admitting: Adult Health

## 2012-09-05 ENCOUNTER — Ambulatory Visit (INDEPENDENT_AMBULATORY_CARE_PROVIDER_SITE_OTHER): Payer: 59 | Admitting: Adult Health

## 2012-09-05 ENCOUNTER — Telehealth: Payer: Self-pay | Admitting: *Deleted

## 2012-09-05 ENCOUNTER — Encounter: Payer: Self-pay | Admitting: Adult Health

## 2012-09-05 ENCOUNTER — Ambulatory Visit
Admission: RE | Admit: 2012-09-05 | Discharge: 2012-09-05 | Disposition: A | Discharge status: Home / Self Care | Payer: 59 | Source: Ambulatory Visit | Attending: Adult Health | Admitting: Adult Health

## 2012-09-05 VITALS — BP 100/62 | HR 60 | Temp 98.1°F | Resp 12 | Wt 175.5 lb

## 2012-09-05 DIAGNOSIS — R109 Unspecified abdominal pain: Secondary | ICD-10-CM

## 2012-09-05 DIAGNOSIS — N390 Urinary tract infection, site not specified: Secondary | ICD-10-CM

## 2012-09-05 DIAGNOSIS — R319 Hematuria, unspecified: Secondary | ICD-10-CM

## 2012-09-05 DIAGNOSIS — N1330 Unspecified hydronephrosis: Secondary | ICD-10-CM

## 2012-09-05 LAB — URINALYSIS, ROUTINE W REFLEX MICROSCOPIC
Bilirubin Urine: NEGATIVE
Ketones, ur: NEGATIVE
Leukocytes, UA: NEGATIVE
Nitrite: NEGATIVE
Specific Gravity, Urine: 1.02 (ref 1.000–1.030)
Total Protein, Urine: NEGATIVE
Urine Glucose: NEGATIVE
Urobilinogen, UA: 0.2 (ref 0.0–1.0)
pH: 6 (ref 5.0–8.0)

## 2012-09-05 LAB — POCT URINALYSIS DIPSTICK
Bilirubin, UA: NEGATIVE
Glucose, UA: NEGATIVE
Ketones, UA: NEGATIVE
Leukocytes, UA: NEGATIVE
Nitrite, UA: NEGATIVE
Protein, UA: NEGATIVE
Spec Grav, UA: 1.02
Urobilinogen, UA: 0.2
pH, UA: 6

## 2012-09-05 LAB — CREATININE, SERUM: Creat: 1.3 mg/dL (ref 0.50–1.35)

## 2012-09-05 NOTE — Addendum Note (Signed)
Addended by: Montine Circle D on: 09/05/2012 12:29 PM   Modules accepted: Orders

## 2012-09-05 NOTE — Telephone Encounter (Signed)
Spoke with patient. Sent for stat creatinine. Has appt with [facility] tomorrow. Spoke with Asher Muir, triage nurse, at [facility].

## 2012-09-05 NOTE — Progress Notes (Signed)
  Subjective:    Patient ID: Andrew Vazquez, male    DOB: Sep 20, 1975, 37 y.o.   MRN: [id number]  HPI  Patient is a healthy 37 year old male who presents to clinic for followup of hematuria. Patient was seen in clinic 08/15/2012 and was treated with Levaquin 500 mg daily x10 days for urinary tract infection. Patient reports still having right flank pain. Pain was so severe during the night that it woke him. He denies fever, chills, superpubic tenderness or dysuria. He has been taking high dose ibuprofen (600 mg) to help with the pain.   Review of Systems  Constitutional: Negative for fever and chills.  Gastrointestinal: Negative for nausea and vomiting.  Genitourinary: Positive for flank pain. Negative for dysuria and hematuria.  Neurological: Negative.   Psychiatric/Behavioral: Negative.        Objective:   Physical Exam  Constitutional: He is oriented to person, place, and time. He appears well-developed and well-nourished. No distress.  Cardiovascular: Normal rate and regular rhythm.   Pulmonary/Chest: Effort normal. No respiratory distress.  Abdominal: Soft. There is no tenderness.  Genitourinary:  Right costovertebral tenderness  Musculoskeletal: Normal range of motion.  Neurological: He is alert and oriented to person, place, and time.  Skin: Skin is warm and dry.  Psychiatric: He has a normal mood and affect. His behavior is normal. Judgment and thought content normal.          Assessment & Plan:

## 2012-09-05 NOTE — Assessment & Plan Note (Signed)
Patient is status post 10 days of Levaquin treated for UTI. Still having right flank pain. UA dipstick shows small amount of blood. Send urine for culture and urinalysis. CT scan of abdomen and pelvis to rule out kidney stone. Offered a prescription for stronger pain medicine such as hydrocodone. Patient reports that he has hydrocodone at home from previous event and will take as needed.

## 2012-09-05 NOTE — Telephone Encounter (Signed)
Moderate hydro-nephrosis caused by 6X2X6 mm  Right UPJ caliculus 2 non obstructive Renal left lower pole renal calculi 3 mm each. Appendix unremarkable .

## 2012-09-07 LAB — URINE CULTURE
Colony Count: NO GROWTH
Organism ID, Bacteria: NO GROWTH

## 2012-12-26 ENCOUNTER — Other Ambulatory Visit: Payer: Self-pay

## 2013-12-05 ENCOUNTER — Other Ambulatory Visit: Payer: Self-pay

## 2014-02-03 ENCOUNTER — Telehealth: Payer: Self-pay | Admitting: *Deleted

## 2014-02-03 NOTE — Telephone Encounter (Signed)
Andrew Vazquez:  02/02/14 9:18 am --Caller states has like a warm sensation in his pelvic area and testicle same area where he had a hernia, has lasted over the last 10 days. Denies doing any heavy lifting or any specific event that may have preceded a "potential" injury     Pt is scheduled for an appt on 02/05/14

## 2014-02-05 ENCOUNTER — Encounter: Payer: Self-pay | Admitting: Internal Medicine

## 2014-02-05 ENCOUNTER — Ambulatory Visit (INDEPENDENT_AMBULATORY_CARE_PROVIDER_SITE_OTHER): Payer: 59 | Admitting: Internal Medicine

## 2014-02-05 VITALS — BP 107/63 | HR 62 | Temp 98.1°F | Ht 71.0 in | Wt 182.0 lb

## 2014-02-05 DIAGNOSIS — R1032 Left lower quadrant pain: Secondary | ICD-10-CM

## 2014-02-05 DIAGNOSIS — Z800 Family history of malignant neoplasm of digestive organs: Secondary | ICD-10-CM | POA: Insufficient documentation

## 2014-02-05 LAB — CBC WITH DIFFERENTIAL/PLATELET
Basophils Absolute: 0 10*3/uL (ref 0.0–0.1)
Basophils Relative: 0.5 % (ref 0.0–3.0)
Eosinophils Absolute: 0.2 10*3/uL (ref 0.0–0.7)
Eosinophils Relative: 2.4 % (ref 0.0–5.0)
HCT: 48.2 % (ref 39.0–52.0)
Hemoglobin: 16.1 g/dL (ref 13.0–17.0)
Lymphocytes Relative: 37.3 % (ref 12.0–46.0)
Lymphs Abs: 3 10*3/uL (ref 0.7–4.0)
MCHC: 33.5 g/dL (ref 30.0–36.0)
MCV: 95.1 fl (ref 78.0–100.0)
Monocytes Absolute: 0.6 10*3/uL (ref 0.1–1.0)
Monocytes Relative: 7.8 % (ref 3.0–12.0)
Neutro Abs: 4.2 10*3/uL (ref 1.4–7.7)
Neutrophils Relative %: 52 % (ref 43.0–77.0)
Platelets: 266 10*3/uL (ref 150.0–400.0)
RBC: 5.07 Mil/uL (ref 4.22–5.81)
RDW: 13.1 % (ref 11.5–15.5)
WBC: 8.1 10*3/uL (ref 4.0–10.5)

## 2014-02-05 LAB — COMPREHENSIVE METABOLIC PANEL
ALT: 24 U/L (ref 0–53)
AST: 20 U/L (ref 0–37)
Albumin: 4.3 g/dL (ref 3.5–5.2)
Alkaline Phosphatase: 86 U/L (ref 39–117)
BUN: 14 mg/dL (ref 6–23)
CO2: 29 mEq/L (ref 19–32)
Calcium: 9.9 mg/dL (ref 8.4–10.5)
Chloride: 101 mEq/L (ref 96–112)
Creatinine, Ser: 1.1 mg/dL (ref 0.4–1.5)
GFR: 82.66 mL/min (ref >60.00)
Glucose, Bld: 72 mg/dL (ref 70–99)
Potassium: 4.2 mEq/L (ref 3.5–5.1)
Sodium: 138 mEq/L (ref 135–145)
Total Bilirubin: 0.6 mg/dL (ref 0.2–1.2)
Total Protein: 7.3 g/dL (ref 6.0–8.3)

## 2014-02-05 NOTE — Progress Notes (Signed)
Subjective:    Patient ID: Andrew Vazquez, male    DOB: 09/06/1975, 38 y.o.   MRN: [id number]  HPI 38YO male presents for acute visit.  Left pelvic pain - had bilateral inguinal hernia repair 3 years ago.  Over last 2-3 weeks, having intermittent left groin burning pain. No trauma or injury to area. No change in bowel habits. No blood in stool. Pain comes and goes, but is worse with strenuous activity. Have some occasional left testicular discomfort at times.   Past medical, surgical, family and social history per today's encounter.  Review of Systems  Constitutional: Negative for fever, chills, activity change, appetite change, fatigue and unexpected weight change.  Eyes: Negative for visual disturbance.  Respiratory: Negative for cough and shortness of breath.   Cardiovascular: Negative for chest pain, palpitations and leg swelling.  Gastrointestinal: Positive for abdominal pain. Negative for nausea, vomiting, diarrhea, constipation, blood in stool, abdominal distention and anal bleeding.  Genitourinary: Positive for testicular pain. Negative for dysuria, urgency, frequency, hematuria, flank pain, discharge, penile swelling, scrotal swelling, difficulty urinating, genital sores and penile pain.  Musculoskeletal: Negative for arthralgias and gait problem.  Skin: Negative for color change and rash.  Hematological: Negative for adenopathy.  Psychiatric/Behavioral: Negative for sleep disturbance and dysphoric mood. The patient is not nervous/anxious.        Objective:    BP 107/63 mmHg  Pulse 62  Temp(Src) 98.1 F (36.7 C) (Oral)  Ht 5\' 11"  (1.803 m)  Wt 182 lb (82.555 kg)  BMI 25.40 kg/m2  SpO2 98% Physical Exam  Constitutional: He is oriented to person, place, and time. He appears well-developed and well-nourished. No distress.  HENT:  Head: Normocephalic and atraumatic.  Right Ear: External ear normal.  Left Ear: External ear normal.  Nose: Nose normal.  Mouth/Throat:  Oropharynx is clear and moist.  Eyes: Conjunctivae and EOM are normal. Pupils are equal, round, and reactive to light. Right eye exhibits no discharge. Left eye exhibits no discharge. No scleral icterus.  Neck: Normal range of motion. Neck supple. No tracheal deviation present. No thyromegaly present.  Cardiovascular: Normal rate, regular rhythm and normal heart sounds.  Exam reveals no gallop and no friction rub.   No murmur heard. Pulmonary/Chest: Effort normal and breath sounds normal. No accessory muscle usage. No tachypnea. No respiratory distress. He has no decreased breath sounds. He has no wheezes. He has no rhonchi. He has no rales. He exhibits no tenderness.  Abdominal: Soft. Bowel sounds are normal. He exhibits no distension and no mass. There is tenderness (mild left inguinal area). There is no rebound and no guarding.  Genitourinary: Testes normal.    Right testis shows no mass, no swelling and no tenderness. Left testis shows no mass, no swelling and no tenderness. Circumcised. No penile erythema or penile tenderness. No discharge found.  Musculoskeletal: Normal range of motion. He exhibits no edema.  Lymphadenopathy:    He has no cervical adenopathy.  Neurological: He is alert and oriented to person, place, and time. No cranial nerve deficit. Coordination normal.  Skin: Skin is warm and dry. No rash noted. He is not diaphoretic. No erythema. No pallor.  Psychiatric: He has a normal mood and affect. His behavior is normal. Judgment and thought content normal.          Assessment & Plan:   Problem List Items Addressed This Visit      Unprioritized   Abdominal pain, left lower quadrant - Primary  Left groin and testicular pain. S/p bilateral inguinal hernia repair in 2012. Exam remarkable for possible defect medially, noted on Valsalva. Will set up CT abdomen/pelvis for further evaluation.    Relevant Orders      CT Abdomen Pelvis W Contrast      CBC with Differential        Comprehensive metabolic panel      PSA, total and free   Family history of colon cancer       Return in about 1 week (around 02/12/2014) for Recheck.

## 2014-02-05 NOTE — Patient Instructions (Signed)
We will set up a CT scan of the abdomen for further evaluation of left groin pain.  Follow up in 1-2 weeks.

## 2014-02-05 NOTE — Progress Notes (Signed)
Pre visit review using our clinic review tool, if applicable. No additional management support is needed unless otherwise documented below in the visit note. 

## 2014-02-05 NOTE — Assessment & Plan Note (Signed)
Left groin and testicular pain. S/p bilateral inguinal hernia repair in 2012. Exam remarkable for possible defect medially, noted on Valsalva. Will set up CT abdomen/pelvis for further evaluation.

## 2014-02-06 ENCOUNTER — Telehealth: Payer: Self-pay | Admitting: Internal Medicine

## 2014-02-06 LAB — PSA, TOTAL AND FREE
PSA, Free Pct: 26 % (ref >25)
PSA, Free: 0.15 ng/mL
PSA: 0.58 ng/mL (ref <=4.00)

## 2014-02-06 NOTE — Telephone Encounter (Signed)
emmi emailed °

## 2014-02-11 ENCOUNTER — Ambulatory Visit: Payer: Self-pay | Admitting: Internal Medicine

## 2014-02-12 ENCOUNTER — Ambulatory Visit (INDEPENDENT_AMBULATORY_CARE_PROVIDER_SITE_OTHER): Payer: 59 | Admitting: Internal Medicine

## 2014-02-12 ENCOUNTER — Encounter: Payer: Self-pay | Admitting: Internal Medicine

## 2014-02-12 ENCOUNTER — Telehealth: Payer: Self-pay

## 2014-02-12 VITALS — BP 98/65 | HR 70 | Temp 97.6°F | Ht 71.0 in | Wt 181.8 lb

## 2014-02-12 DIAGNOSIS — R1032 Left lower quadrant pain: Secondary | ICD-10-CM

## 2014-02-12 DIAGNOSIS — N200 Calculus of kidney: Secondary | ICD-10-CM

## 2014-02-12 MED ORDER — TAMSULOSIN HCL 0.4 MG PO CAPS: 0.4000 mg | ORAL_CAPSULE | Freq: Every day | ORAL | Status: DC

## 2014-02-12 NOTE — Telephone Encounter (Signed)
OK. CT from [facility] was completely normal. Nothing to explain groin pain.  Can you ask if he is still having pain?  If yes, then we should see him today.

## 2014-02-12 NOTE — Progress Notes (Signed)
Pre visit review using our clinic review tool, if applicable. No additional management support is needed unless otherwise documented below in the visit note. 

## 2014-02-12 NOTE — Assessment & Plan Note (Signed)
Will restart Flomax to help with passage of bilateral nephrolithiasis, as this worked well for him in the past. He understands risks of this medication.

## 2014-02-12 NOTE — Patient Instructions (Signed)
Follow up in 4 weeks or sooner as needed.

## 2014-02-12 NOTE — Progress Notes (Signed)
Subjective:    Patient ID: Andrew Vazquez, male    DOB: 1976/01/07, 38 y.o.   MRN: [id number]  HPI  38YO male presents for follow up.  Last seen 12/17 with left lower abdominal/groin pain. Concern for recurrent inguinal hernia, and CT abdomen was performed. No abnormalities seen on imaging except for bilateral small kidney stones that were non-obstructive. Continues to have intermittent "fullness or pressure" in his testicles bilaterally. No penile pain. No urinary symptoms. No swelling or mass noted. He notes that symptoms have been present to some extent for >5 years.    Past medical, surgical, family and social history per today's encounter.  Review of Systems  Constitutional: Negative for fever, chills, activity change, appetite change, fatigue and unexpected weight change.  Eyes: Negative for visual disturbance.  Respiratory: Negative for cough and shortness of breath.   Cardiovascular: Negative for chest pain, palpitations and leg swelling.  Gastrointestinal: Negative for nausea, vomiting, abdominal pain, diarrhea, constipation, abdominal distention and rectal pain.  Genitourinary: Positive for testicular pain. Negative for dysuria, urgency, frequency, hematuria, discharge, penile swelling, scrotal swelling, difficulty urinating, genital sores and penile pain.  Musculoskeletal: Negative for arthralgias and gait problem.  Skin: Negative for color change and rash.  Hematological: Negative for adenopathy.  Psychiatric/Behavioral: Negative for sleep disturbance and dysphoric mood. The patient is not nervous/anxious.        Objective:    BP 98/65 mmHg  Pulse 70  Temp(Src) 97.6 F (36.4 C) (Oral)  Ht 5\' 11"  (1.803 m)  Wt 181 lb 12 oz (82.441 kg)  BMI 25.36 kg/m2  SpO2 98% Physical Exam  Constitutional: He is oriented to person, place, and time. He appears well-developed and well-nourished. No distress.  HENT:  Head: Normocephalic and atraumatic.  Right Ear: External ear  normal.  Left Ear: External ear normal.  Nose: Nose normal.  Mouth/Throat: Oropharynx is clear and moist.  Eyes: Conjunctivae and EOM are normal. Pupils are equal, round, and reactive to light. Right eye exhibits no discharge. Left eye exhibits no discharge. No scleral icterus.  Neck: Normal range of motion. Neck supple. No tracheal deviation present. No thyromegaly present.  Cardiovascular: Normal rate, regular rhythm and normal heart sounds.  Exam reveals no gallop and no friction rub.   No murmur heard. Pulmonary/Chest: Effort normal and breath sounds normal. No accessory muscle usage. No tachypnea. No respiratory distress. He has no decreased breath sounds. He has no wheezes. He has no rhonchi. He has no rales. He exhibits no tenderness.  Musculoskeletal: Normal range of motion. He exhibits no edema.  Lymphadenopathy:    He has no cervical adenopathy.  Neurological: He is alert and oriented to person, place, and time. No cranial nerve deficit. Coordination normal.  Skin: Skin is warm and dry. No rash noted. He is not diaphoretic. No erythema. No pallor.  Psychiatric: He has a normal mood and affect. His behavior is normal. Judgment and thought content normal.          Assessment & Plan:   Problem List Items Addressed This Visit      Unprioritized   Abdominal pain, left lower quadrant - Primary    Left groin pain now described more as testicular fullness or pressure. Exam normal. CT abdomen and pelvis was normal with no abnormality of previous inguinal hernia repair. We discussed potential causes. Reviewed scrotal US from 2011 which showed mild hydrocele bilaterally. Discussed possible repeat of [place] to evaluate for hydrocele and torsion. He would like to hold  off for now. Will monitor. Discussed referral back to Urology, but he declines. Follow up in 4 weeks and prn.    Nephrolithiasis    Will restart Flomax to help with passage of bilateral nephrolithiasis, as this worked well for  him in the past. He understands risks of this medication.    Relevant Medications      tamsulosin (FLOMAX) capsule 0.4 mg       Return in about 4 weeks (around 03/12/2014).

## 2014-02-12 NOTE — Assessment & Plan Note (Signed)
Left groin pain now described more as testicular fullness or pressure. Exam normal. CT abdomen and pelvis was normal with no abnormality of previous inguinal hernia repair. We discussed potential causes. Reviewed scrotal US from 2011 which showed mild hydrocele bilaterally. Discussed possible repeat of [place] to evaluate for hydrocele and torsion. He would like to hold off for now. Will monitor. Discussed referral back to Urology, but he declines. Follow up in 4 weeks and prn.

## 2014-02-12 NOTE — Telephone Encounter (Signed)
Patient wanted to keep apt due to still having "brusing" feeling.

## 2014-02-12 NOTE — Telephone Encounter (Signed)
The patient stated he had his ct done at [facility] off [address].

## 2014-02-16 ENCOUNTER — Telehealth: Payer: Self-pay | Admitting: Internal Medicine

## 2014-02-16 NOTE — Telephone Encounter (Signed)
Please see below.

## 2014-02-16 NOTE — Telephone Encounter (Signed)
Patient said every since he came in to see you on 02/12/14 he has been experiencing great stomach and lower back pain.  On a scale of 1-10 for pain, he is experiencing a 5 in pain. He would like for you to give him a call to discuss what has been going on with his stomach.

## 2014-02-16 NOTE — Telephone Encounter (Signed)
Pt states that pains are different from pains he had prior to CT, feels like shooting pain comes and goes, possibly gas from the drink he drank for the CT.  Offered him an appt on 03/05/14 but he was not happy, asking for something sooner.

## 2014-02-16 NOTE — Telephone Encounter (Signed)
His CT scan was normal with no findings to explain stomach pain. We should see him back in a visit to evaluate

## 2014-02-17 NOTE — Telephone Encounter (Signed)
OK. Needs to be seen then. 11:30am tomorrow if open

## 2014-02-17 NOTE — Telephone Encounter (Signed)
Pt states that he tried some Gas-X the other day, seemed to help, But last night pt states that he moved a certain way and pain was worse and different.

## 2014-02-17 NOTE — Telephone Encounter (Signed)
Notified pt and put pt on the schedule

## 2014-02-17 NOTE — Telephone Encounter (Signed)
If this seems like gas pains, has he tried Mylicon? We should try this. If no improvement, we can work him in today.

## 2014-02-18 ENCOUNTER — Encounter: Payer: Self-pay | Admitting: Internal Medicine

## 2014-02-18 ENCOUNTER — Ambulatory Visit (INDEPENDENT_AMBULATORY_CARE_PROVIDER_SITE_OTHER): Payer: 59 | Admitting: Internal Medicine

## 2014-02-18 VITALS — BP 124/69 | HR 71 | Temp 98.1°F | Ht 71.0 in | Wt 182.0 lb

## 2014-02-18 DIAGNOSIS — R1013 Epigastric pain: Secondary | ICD-10-CM

## 2014-02-18 MED ORDER — PANTOPRAZOLE SODIUM 40 MG PO TBEC: 40.0000 mg | DELAYED_RELEASE_TABLET | Freq: Every day | ORAL | Status: DC

## 2014-02-18 NOTE — Assessment & Plan Note (Signed)
Epigastric abdominal pain. Tender over epigastric area on exam. Concerning for gastritis or ulcer. Will start Pantoprazole 40mg  daily. Check H. Pylori. Follow up 2 weeks. If no improvement, then referral for EGD.

## 2014-02-18 NOTE — Progress Notes (Signed)
   Subjective:    Patient ID: Andrew Vazquez, male    DOB: 1976-01-23, 38 y.o.   MRN: [id number]  HPI 38YO male presents for acute visit.  Seen on 12/17 and 12/24 with left lower abdominal pain and testicular pressure. CT showed no abnormalities. Since 12/24 feels intermittent abdominal distension and pain in the epigastric area. No nausea, vomiting, diarrhea. No blood in stool. No fever.   Past medical, surgical, family and social history per today's encounter.  Review of Systems  Constitutional: Negative for fever, chills, activity change, appetite change, fatigue and unexpected weight change.  Eyes: Negative for visual disturbance.  Respiratory: Negative for cough and shortness of breath.   Cardiovascular: Negative for chest pain, palpitations and leg swelling.  Gastrointestinal: Positive for abdominal pain and abdominal distention. Negative for nausea, vomiting, diarrhea, constipation, blood in stool and rectal pain.  Genitourinary: Negative for dysuria, urgency and difficulty urinating.  Musculoskeletal: Negative for arthralgias and gait problem.  Skin: Negative for color change and rash.  Hematological: Negative for adenopathy.  Psychiatric/Behavioral: Negative for sleep disturbance and dysphoric mood. The patient is not nervous/anxious.        Objective:    BP 124/69 mmHg  Pulse 71  Temp(Src) 98.1 F (36.7 C) (Oral)  Ht 5\' 11"  (1.803 m)  Wt 182 lb (82.555 kg)  BMI 25.40 kg/m2  SpO2 98% Physical Exam  Constitutional: He is oriented to person, place, and time. He appears well-developed and well-nourished.  HENT:  Head: Normocephalic and atraumatic.  Eyes: Conjunctivae and EOM are normal. Pupils are equal, round, and reactive to light.  Neck: Normal range of motion.  Pulmonary/Chest: Effort normal.  Abdominal: Soft. Bowel sounds are normal. He exhibits no distension and no mass. There is tenderness (epigastric). There is no rebound and no guarding.  Musculoskeletal:  Normal range of motion.  Neurological: He is alert and oriented to person, place, and time.  Skin: Skin is warm and dry. He is not diaphoretic.  Psychiatric: He has a normal mood and affect. His behavior is normal. Thought content normal.          Assessment & Plan:   Problem List Items Addressed This Visit      Unprioritized   Abdominal pain, epigastric - Primary    Epigastric abdominal pain. Tender over epigastric area on exam. Concerning for gastritis or ulcer. Will start Pantoprazole 40mg  daily. Check H. Pylori. Follow up 2 weeks. If no improvement, then referral for EGD.    Relevant Medications      pantoprazole (PROTONIX) EC tablet   Other Relevant Orders      H. pylori breath test       Return in about 2 weeks (around 03/04/2014).

## 2014-02-18 NOTE — Progress Notes (Signed)
Pre visit review using our clinic review tool, if applicable. No additional management support is needed unless otherwise documented below in the visit note. 

## 2014-02-18 NOTE — Patient Instructions (Addendum)
We will send breath test for H. Pylori today.  Start Pantropazole 40mg  daily for the next 2 weeks.  Continue Gas-x or Peptobismal.

## 2014-02-19 LAB — H. PYLORI BREATH TEST: H. pylori Breath Test: NOT DETECTED

## 2014-03-12 ENCOUNTER — Encounter: Payer: Self-pay | Admitting: Internal Medicine

## 2014-04-15 ENCOUNTER — Ambulatory Visit (INDEPENDENT_AMBULATORY_CARE_PROVIDER_SITE_OTHER): Payer: 59 | Admitting: Nurse Practitioner

## 2014-04-15 ENCOUNTER — Encounter: Payer: Self-pay | Admitting: Nurse Practitioner

## 2014-04-15 DIAGNOSIS — N200 Calculus of kidney: Secondary | ICD-10-CM

## 2014-04-15 DIAGNOSIS — R319 Hematuria, unspecified: Secondary | ICD-10-CM

## 2014-04-15 DIAGNOSIS — N390 Urinary tract infection, site not specified: Secondary | ICD-10-CM

## 2014-04-15 LAB — POCT URINALYSIS DIPSTICK
Bilirubin, UA: NEGATIVE
Glucose, UA: NEGATIVE
Nitrite, UA: NEGATIVE
Protein, UA: 30
Spec Grav, UA: 1.025
Urobilinogen, UA: 0.2
pH, UA: 6

## 2014-04-15 MED ORDER — ONDANSETRON HCL 4 MG PO TABS: 4.0000 mg | ORAL_TABLET | Freq: Three times a day (TID) | ORAL | Status: DC | PRN

## 2014-04-15 MED ORDER — HYDROCODONE-ACETAMINOPHEN 5-325 MG PO TABS: 1.0000 | ORAL_TABLET | Freq: Four times a day (QID) | ORAL | Status: DC | PRN

## 2014-04-15 NOTE — Progress Notes (Signed)
Subjective:    Patient ID: Andrew Vazquez, male    DOB: [birth year], 39 y.o.   MRN: [id number]  HPI  Andrew Vazquez is a 39 yo male with a chief complaint of kidney stone onset this morning.   1) He reports having right testicular pain and fullness, which he reports this is his usual sign for kidney stone. He also had right flank pain. Went to work, ibuprofen not helpful, was not bad driving home, laid down for an hour, felt bad again.   Took flomax this morning, which he reports was helpful last time.    Review of Systems  Constitutional: Negative for fever, chills, diaphoresis, activity change and fatigue.  Eyes: Negative for visual disturbance.  Respiratory: Negative for chest tightness, shortness of breath and wheezing.   Cardiovascular: Negative for chest pain, palpitations and leg swelling.  Gastrointestinal: Positive for nausea and vomiting. Negative for diarrhea and rectal pain.  Genitourinary: Negative for dysuria and difficulty urinating.  Musculoskeletal: Positive for back pain. Negative for myalgias, joint swelling, arthralgias, gait problem, neck pain and neck stiffness.  Skin: Negative for rash.  Neurological: Negative for dizziness, weakness and numbness.   Past Medical History  Diagnosis Date  . Hernia, inguinal, unilateral     History   Social History  . Marital Status: Married    Spouse Name: N/A  . Number of Children: N/A  . Years of Education: N/A   Occupational History  . Not on file.   Social History Main Topics  . Smoking status: Current Every Day Smoker -- 1.00 packs/day  . Smokeless tobacco: Not on file  . Alcohol Use: Not on file  . Drug Use: Not on file  . Sexual Activity: Not on file   Other Topics Concern  . Not on file   Social History Narrative    No past surgical history on file.  Family History  Problem Relation Age of Onset  . Cancer Father 23    Colon    No Known Allergies  Current Outpatient Prescriptions on File Prior  to Visit  Medication Sig Dispense Refill  . tamsulosin (FLOMAX) 0.4 MG CAPS capsule Take 1 capsule (0.4 mg total) by mouth daily. 30 capsule 3   No current facility-administered medications on file prior to visit.      Objective:   Physical Exam  Constitutional: He is oriented to person, place, and time. He appears well-developed and well-nourished. No distress.  BP 122/74 mmHg  Pulse 71  Temp(Src) 98.1 F (36.7 C) (Oral)  Resp 14  Ht 5\' 11"  (1.803 m)  Wt 182 lb 1.9 oz (82.609 kg)  BMI 25.41 kg/m2  SpO2 97%   HENT:  Head: Normocephalic and atraumatic.  Right Ear: External ear normal.  Left Ear: External ear normal.  Eyes: Right eye exhibits no discharge. Left eye exhibits no discharge. No scleral icterus.  Cardiovascular: Normal rate, regular rhythm, normal heart sounds and intact distal pulses.  Exam reveals no gallop and no friction rub.   No murmur heard. Pulmonary/Chest: Effort normal and breath sounds normal. No respiratory distress. He has no wheezes. He has no rales. He exhibits no tenderness.  Abdominal: Soft. Bowel sounds are normal. He exhibits no distension and no mass. There is no tenderness. There is CVA tenderness. There is no rebound and no guarding.  Neurological: He is alert and oriented to person, place, and time.  Skin: Skin is warm and dry. No rash noted. He is not diaphoretic.  Psychiatric: He  has a normal mood and affect. His behavior is normal. Judgment and thought content normal.      Assessment & Plan:

## 2014-04-15 NOTE — Progress Notes (Signed)
Pre visit review using our clinic review tool, if applicable. No additional management support is needed unless otherwise documented below in the visit note. 

## 2014-04-15 NOTE — Patient Instructions (Signed)
No antibiotics until culture comes back.   Go to ER if worsening or unable to keep food/drink down.

## 2014-04-17 LAB — URINE CULTURE
Colony Count: NO GROWTH
Organism ID, Bacteria: NO GROWTH

## 2014-04-18 NOTE — Assessment & Plan Note (Signed)
Worsening. Continue with Flomax for passage, Norco for pain, zofran for nausea. Asked him to go to ER if unable to keep food down and/or severe pain. Will follow as needed.

## 2014-10-27 ENCOUNTER — Encounter: Payer: Self-pay | Admitting: Emergency Medicine

## 2014-10-27 ENCOUNTER — Emergency Department: Payer: 59

## 2014-10-27 ENCOUNTER — Emergency Department
Admission: EM | Admit: 2014-10-27 | Discharge: 2014-10-27 | Disposition: A | Discharge status: Home / Self Care | Payer: 59 | Attending: Emergency Medicine | Admitting: Emergency Medicine

## 2014-10-27 DIAGNOSIS — R109 Unspecified abdominal pain: Secondary | ICD-10-CM | POA: Diagnosis present

## 2014-10-27 DIAGNOSIS — N200 Calculus of kidney: Secondary | ICD-10-CM | POA: Diagnosis not present

## 2014-10-27 DIAGNOSIS — Z720 Tobacco use: Secondary | ICD-10-CM | POA: Insufficient documentation

## 2014-10-27 HISTORY — DX: Disorder of kidney and ureter, unspecified: N28.9

## 2014-10-27 LAB — BASIC METABOLIC PANEL WITH GFR
Anion gap: 8 (ref 5–15)
BUN: 18 mg/dL (ref 6–20)
CO2: 26 mmol/L (ref 22–32)
Calcium: 9.4 mg/dL (ref 8.9–10.3)
Chloride: 101 mmol/L (ref 101–111)
Creatinine, Ser: 1.7 mg/dL — ABNORMAL HIGH (ref 0.61–1.24)
GFR calc Af Amer: 57 mL/min — ABNORMAL LOW
GFR calc non Af Amer: 49 mL/min — ABNORMAL LOW
Glucose, Bld: 116 mg/dL — ABNORMAL HIGH (ref 65–99)
Potassium: 4.1 mmol/L (ref 3.5–5.1)
Sodium: 135 mmol/L (ref 135–145)

## 2014-10-27 LAB — CBC
HCT: 45.3 % (ref 40.0–52.0)
Hemoglobin: 15.5 g/dL (ref 13.0–18.0)
MCH: 32.4 pg (ref 26.0–34.0)
MCHC: 34.3 g/dL (ref 32.0–36.0)
MCV: 94.5 fL (ref 80.0–100.0)
Platelets: 213 K/uL (ref 150–440)
RBC: 4.8 MIL/uL (ref 4.40–5.90)
RDW: 13 % (ref 11.5–14.5)
WBC: 15.5 K/uL — ABNORMAL HIGH (ref 3.8–10.6)

## 2014-10-27 LAB — URINALYSIS COMPLETE WITH MICROSCOPIC (ARMC ONLY)
Bacteria, UA: NONE SEEN
Specific Gravity, Urine: 1.019 (ref 1.005–1.030)
Squamous Epithelial / LPF: NONE SEEN

## 2014-10-27 MED ORDER — TAMSULOSIN HCL 0.4 MG PO CAPS: 0.4000 mg | ORAL_CAPSULE | Freq: Every day | ORAL | Status: DC

## 2014-10-27 MED ORDER — SODIUM CHLORIDE 0.9 % IV BOLUS (SEPSIS)
1000.0000 mL | Freq: Once | INTRAVENOUS | Status: AC
  Administered 2014-10-27: 1000 mL via INTRAVENOUS

## 2014-10-27 MED ORDER — ONDANSETRON 4 MG PO TBDP: 4.0000 mg | ORAL_TABLET | Freq: Four times a day (QID) | ORAL | Status: DC | PRN

## 2014-10-27 MED ORDER — ONDANSETRON HCL 4 MG/2ML IJ SOLN
4.0000 mg | Freq: Once | INTRAMUSCULAR | Status: AC
  Administered 2014-10-27: 4 mg via INTRAVENOUS
  Filled 2014-10-27: qty 2

## 2014-10-27 MED ORDER — KETOROLAC TROMETHAMINE 30 MG/ML IJ SOLN
30.0000 mg | Freq: Once | INTRAMUSCULAR | Status: AC
  Administered 2014-10-27: 30 mg via INTRAVENOUS
  Filled 2014-10-27: qty 1

## 2014-10-27 MED ORDER — MORPHINE SULFATE (PF) 4 MG/ML IV SOLN
4.0000 mg | Freq: Once | INTRAVENOUS | Status: AC
  Administered 2014-10-27: 4 mg via INTRAVENOUS
  Filled 2014-10-27: qty 1

## 2014-10-27 NOTE — ED Notes (Signed)
Patient to ED with c/o right flank pain reports worst kidney stone ever. Patient reports pain started last pm and he can not function.

## 2014-10-27 NOTE — ED Provider Notes (Signed)
[facility] Emergency Department Provider Note REMINDER - THIS NOTE IS NOT A FINAL MEDICAL RECORD UNTIL IT IS SIGNED. UNTIL THEN, THE CONTENT BELOW MAY REFLECT INFORMATION FROM A DOCUMENTATION TEMPLATE, NOT THE ACTUAL PATIENT VISIT. ____________________________________________  Time seen: Approximately 10:16 PM  I have reviewed the triage vital signs and the nursing notes.   HISTORY  Chief Complaint Flank Pain    HPI Andrew Vazquez is a 39 y.o. male to previous history of multiple kidney stones. Yesterday evening he began having sharp, and taking right flank pain. He reports that this is a same as previous kidney stones that she has passed. He started Flomax, and hydrocodone as previously prescribed by his doctor for recurrent stones. He reports, but he continues to have severe right flank pain with nausea. No vomiting. No fever. He reports his urine has been orange/red in color and this feels exactly like previous kidney stone.  He denies having pain in his right lower abdomen, no diarrhea and no nausea or vomiting. He denies any chest pain or difficulty breathing. No pain in his testicles or groin except for some slight radiation from his flank down.  Reports she is previously passed all of his kidney stones without surgical intervention. Follow-up with urologist in [place].  Past Medical History  Diagnosis Date  . Hernia, inguinal, unilateral   . Renal disorder     Patient Active Problem List   Diagnosis Date Noted  . Abdominal pain, epigastric 02/18/2014  . Nephrolithiasis 02/12/2014  . Abdominal pain, left lower quadrant 02/05/2014  . Family history of colon cancer 02/05/2014  . Tobacco abuse 04/10/2011    History reviewed. No pertinent past surgical history.  Current Outpatient Rx  Name  Route  Sig  Dispense  Refill  . HYDROcodone-acetaminophen (NORCO/VICODIN) 5-325 MG per tablet   Oral   Take 1 tablet by mouth every 6 (six) hours as needed  for moderate pain.   30 tablet   0   . ondansetron (ZOFRAN ODT) 4 MG disintegrating tablet   Oral   Take 1 tablet (4 mg total) by mouth every 6 (six) hours as needed for nausea or vomiting.   20 tablet   0   . ondansetron (ZOFRAN) 4 MG tablet   Oral   Take 1 tablet (4 mg total) by mouth every 8 (eight) hours as needed for nausea or vomiting.   20 tablet   0   . tamsulosin (FLOMAX) 0.4 MG CAPS capsule   Oral   Take 1 capsule (0.4 mg total) by mouth daily.   30 capsule   0     Allergies Review of patient's allergies indicates no known allergies.  Family History  Problem Relation Age of Onset  . Cancer Father 15    Colon    Social History Social History  Substance Use Topics  . Smoking status: Current Every Day Smoker -- 1.00 packs/day  . Smokeless tobacco: None  . Alcohol Use: Yes    Review of Systems Constitutional: No fever/chills Eyes: No visual changes. ENT: No sore throat. Cardiovascular: Denies chest pain. Respiratory: Denies shortness of breath. Gastrointestinal:  no vomiting.  No diarrhea.  No constipation. Genitourinary: Negative for dysuria. Musculoskeletal: Negative for back pain except along the area of the right kidney. Skin: Negative for rash. Neurological: Negative for headaches, focal weakness or numbness.  10-point ROS otherwise negative.  ____________________________________________   PHYSICAL EXAM:  VITAL SIGNS: ED Triage Vitals  Enc Vitals Group  BP 10/27/14 2115 135/74 mmHg     Pulse Rate 10/27/14 2115 70     Resp 10/27/14 2115 18     Temp 10/27/14 2115 98.4 F (36.9 C)     Temp Source 10/27/14 2115 Oral     SpO2 10/27/14 2115 99 %     Weight 10/27/14 2115 172 lb (78.019 kg)     Height 10/27/14 2115 5\' 10"  (1.778 m)     Head Cir --      Peak Flow --      Pain Score 10/27/14 2116 10     Pain Loc --      Pain Edu? --      Excl. in [place]? --    Constitutional: Alert and oriented. Well appearing and in moderate pain holding  his right flank, sitting up.. Eyes: Conjunctivae are normal. PERRL. EOMI. Head: Atraumatic. Nose: No congestion/rhinnorhea. Mouth/Throat: Mucous membranes are moist.  Oropharynx non-erythematous. Neck: No stridor.   Cardiovascular: Normal rate, regular rhythm. Grossly normal heart sounds.  Good peripheral circulation. Respiratory: Normal respiratory effort.  No retractions. Lungs CTAB. Gastrointestinal: Soft and nontender except along the right mid flank. No distention. No abdominal bruits. No CVA tenderness on the left, moderate CVA tenderness on the right. No Rosving's. No psoas. No focal pain at [place]. Testicles normal descended bilateral nontender. Penis normal appearing, circumcised. No groin pain or mass. Musculoskeletal: No lower extremity tenderness nor edema.  No joint effusions. Neurologic:  Normal speech and language. No gross focal neurologic deficits are appreciated. No gait instability. Skin:  Skin is warm, dry and intact. No rash noted. Psychiatric: Mood and affect are normal. Speech and behavior are normal.  ____________________________________________   LABS (all labs ordered are listed, but only abnormal results are displayed)  Labs Reviewed  URINALYSIS COMPLETEWITH MICROSCOPIC ([place]) - Abnormal; Notable for the following:    Color, Urine ORANGE (*)    APPearance CLEAR (*)    Glucose, UA   (*)    Value: TEST NOT REPORTED DUE TO COLOR INTERFERENCE OF URINE PIGMENT   Bilirubin Urine   (*)    Value: TEST NOT REPORTED DUE TO COLOR INTERFERENCE OF URINE PIGMENT   Ketones, ur   (*)    Value: TEST NOT REPORTED DUE TO COLOR INTERFERENCE OF URINE PIGMENT   Hgb urine dipstick   (*)    Value: TEST NOT REPORTED DUE TO COLOR INTERFERENCE OF URINE PIGMENT   Protein, ur   (*)    Value: TEST NOT REPORTED DUE TO COLOR INTERFERENCE OF URINE PIGMENT   Nitrite   (*)    Value: TEST NOT REPORTED DUE TO COLOR INTERFERENCE OF URINE PIGMENT   Leukocytes, UA   (*)    Value: TEST  NOT REPORTED DUE TO COLOR INTERFERENCE OF URINE PIGMENT   All other components within normal limits  CBC - Abnormal; Notable for the following:    WBC 15.5 (*)    All other components within normal limits  BASIC METABOLIC PANEL - Abnormal; Notable for the following:    Glucose, Bld 116 (*)    Creatinine, Ser 1.70 (*)    GFR calc non Af Amer 49 (*)    GFR calc Af Amer 57 (*)    All other components within normal limits  HEPATIC FUNCTION PANEL   ____________________________________________  EKG    ____________________________________________  RADIOLOGY  CLINICAL DATA: Right flank pain starting last night. Previous history of kidney stones.  EXAM: CT ABDOMEN AND PELVIS WITHOUT CONTRAST  TECHNIQUE: Multidetector CT imaging of the abdomen and pelvis was performed following the standard protocol without IV contrast.  COMPARISON: 02/11/2014  FINDINGS: Lung bases are clear.  Two stones are demonstrated in the distal right ureter just above the ureterovesical junction. Largest stone measures 5 mm diameter. There is proximal hydronephrosis and hydroureter with stranding around the right kidney in ureter. Two stones demonstrated in the lower pole left kidney without evidence of obstruction. No left ureteral stones demonstrated. Bladder wall is not thickened and no bladder stones are identified.  The unenhanced appearance of the liver, spleen, gallbladder, pancreas, adrenal glands, abdominal aorta, inferior vena cava, and retroperitoneal lymph nodes is unremarkable. Stomach, small bowel, and colon are not abnormally distended. Stool fills the colon. No free air or free fluid in the abdomen.  Pelvis: Postoperative changes consistent with bilateral hernia repairs. Prostate gland is not enlarged. Appendix is normal. No free or loculated pelvic fluid collections. No pelvic mass or lymphadenopathy. No destructive bone lesions.  IMPRESSION: Two stones in the distal right  ureter, largest 5 mm diameter. Proximal obstruction of the right ureter and collecting system. 2 small nonobstructing stones demonstrated in the left kidney. ____________________________________________   PROCEDURES  Procedure(s) performed: None  Critical Care performed: No  ____________________________________________   INITIAL IMPRESSION / ASSESSMENT AND PLAN / ED COURSE  Pertinent labs & imaging results that were available during my care of the patient were reviewed by me and considered in my medical decision making (see chart for details).  Patient presents with acute onset of right flank pain and reported hematuria. He denies any infectious symptoms. He does report a long history of kidney stones reports this feels same. Clinically, his assessment does suggest that he is likely having a kidney stone. I discussed the risks and benefits of CAT scan versus renal ultrasound and after discussion of the risks, patient has elected to have a CT scan. Patient does have elevated leukocytosis, but is afebrile, and this is fairly nonspecific and may be indicative of stress reaction, urinalysis has interference due to color but there is no evidence of infection namely no bacteria seen and no significant elevation of his white cells in the urine to suggest any urinary infection.  We will control pain, obtain CT imaging to further evaluate for the presence of possible kidney stone. The patient is young and healthy male, his creatinine is noted to be slightly elevated. We'll hydrate him generously, we will obtain CT imaging to evaluate for evidence of obstruction and though I find it far less likely, CT imaging will also help provide further etiology such as appendicitis though I suspect his white count is mostly stress reaction.  ----------------------------------------- 11:20 PM on 10/27/2014 -----------------------------------------  Patient reports his pain is much better. He is awake and alert  in no distress. He does request refill of Flomax and will provide him with Zofran. Patient has hydrocodone from previous prescriptions. Advised no driving tonight, he'll follow up closely with his urologist. Because of the slight elevation in his creatinine, I did discuss this case with Dr. Jason Coop of urology who advises the patient may be discharged, and can follow-up with his urologist within a week and continue to take oral hydration hydration.  Careful return precautions advised. ____________________________________________   FINAL CLINICAL IMPRESSION(S) / ED DIAGNOSES  Final diagnoses:  Right sided abdominal pain  Kidney stone on right side      Delman Kitten, MD 10/27/14 2344

## 2014-10-27 NOTE — Discharge Instructions (Signed)
You have been seen in the Emergency Department (ED) today for pain that we believe based on your workup, is caused by kidney stones.  As we have discussed, please drink plenty of fluids.  Please make a follow up appointment with the physician(s) listed elsewhere in this documentation.  You may take pain medication as needed but ONLY as prescribed.  Please also take your prescribed Flomax daily.    Please see your doctor as soon as possible as stones may take 1-3 weeks to pass and you may require additional care or medications.  Do not drink alcohol, drive or participate in any other potentially dangerous activities while taking opiate pain medication as it may make you sleepy. Do not take this medication with any other sedating medications, either prescription or over-the-counter. Return to the emergency room right away if you develop fever, pain is out of control, he feel dehydrated, have severe nausea or vomiting, new develop abnormal urine odor, severe chills or other new concerns arise.    Return to the Emergency Department (ED) or call your doctor if you have any worsening pain, fever, painful urination, are unable to urinate, or develop other symptoms that concern you.   Kidney Stones Kidney stones (urolithiasis) are deposits that form inside your kidneys. The intense pain is caused by the stone moving through the urinary tract. When the stone moves, the ureter goes into spasm around the stone. The stone is usually passed in the urine.  CAUSES   A disorder that makes certain neck glands produce too much parathyroid hormone (primary hyperparathyroidism).  A buildup of uric acid crystals, similar to gout in your joints.  Narrowing (stricture) of the ureter.  A kidney obstruction present at birth (congenital obstruction).  Previous surgery on the kidney or ureters.  Numerous kidney infections. SYMPTOMS   Feeling sick to your stomach (nauseous).  Throwing up (vomiting).  Blood in  the urine (hematuria).  Pain that usually spreads (radiates) to the groin.  Frequency or urgency of urination. DIAGNOSIS   Taking a history and physical exam.  Blood or urine tests.  CT scan.  Occasionally, an examination of the inside of the urinary bladder (cystoscopy) is performed. TREATMENT   Observation.  Increasing your fluid intake.  Extracorporeal shock wave lithotripsy--This is a noninvasive procedure that uses shock waves to break up kidney stones.  Surgery may be needed if you have severe pain or persistent obstruction. There are various surgical procedures. Most of the procedures are performed with the use of small instruments. Only small incisions are needed to accommodate these instruments, so recovery time is minimized. The size, location, and chemical composition are all important variables that will determine the proper choice of action for you. Talk to your health care provider to better understand your situation so that you will minimize the risk of injury to yourself and your kidney.  HOME CARE INSTRUCTIONS   Drink enough water and fluids to keep your urine clear or pale yellow. This will help you to pass the stone or stone fragments.  Strain all urine through the provided strainer. Keep all particulate matter and stones for your health care provider to see. The stone causing the pain may be as small as a grain of salt. It is very important to use the strainer each and every time you pass your urine. The collection of your stone will allow your health care provider to analyze it and verify that a stone has actually passed. The stone analysis will often identify  what you can do to reduce the incidence of recurrences.  Only take over-the-counter or prescription medicines for pain, discomfort, or fever as directed by your health care provider.  Make a follow-up appointment with your health care provider as directed.  Get follow-up X-rays if required. The absence of  pain does not always mean that the stone has passed. It may have only stopped moving. If the urine remains completely obstructed, it can cause loss of kidney function or even complete destruction of the kidney. It is your responsibility to make sure X-rays and follow-ups are completed. Ultrasounds of the kidney can show blockages and the status of the kidney. Ultrasounds are not associated with any radiation and can be performed easily in a matter of minutes. SEEK MEDICAL CARE IF:  You experience pain that is progressive and unresponsive to any pain medicine you have been prescribed. SEEK IMMEDIATE MEDICAL CARE IF:   Pain cannot be controlled with the prescribed medicine.  You have a fever or shaking chills.  The severity or intensity of pain increases over 18 hours and is not relieved by pain medicine.  You develop a new onset of abdominal pain.  You feel faint or pass out.  You are unable to urinate. MAKE SURE YOU:   Understand these instructions.  Will watch your condition.  Will get help right away if you are not doing well or get worse. Document Released: 02/06/2005 Document Revised: 10/09/2012 Document Reviewed: 07/10/2012 [facility] Patient Information 2015 [place], [place]. This information is not intended to replace advice given to you by your health care provider. Make sure you discuss any questions you have with your health care provider.

## 2014-10-28 LAB — HEPATIC FUNCTION PANEL
ALBUMIN: 4.6 g/dL (ref 3.5–5.0)
ALT: 34 U/L (ref 17–63)
AST: 46 U/L — ABNORMAL HIGH (ref 15–41)
Alkaline Phosphatase: 89 U/L (ref 38–126)
Bilirubin, Direct: 0.1 mg/dL (ref 0.1–0.5)
Indirect Bilirubin: 0.6 mg/dL (ref 0.3–0.9)
TOTAL PROTEIN: 7.4 g/dL (ref 6.5–8.1)
Total Bilirubin: 0.7 mg/dL (ref 0.3–1.2)

## 2015-12-11 IMAGING — CT CT RENAL STONE PROTOCOL
3 of 4 series · 10 of 46 positions shown, 17 images · non-contrast
Comparison: 02/11/2014

CLINICAL DATA: Right flank pain starting last night. Previous
history of kidney stones.

EXAM:
CT ABDOMEN AND PELVIS WITHOUT CONTRAST
TECHNIQUE: Multidetector CT imaging of the abdomen and pelvis was performed
following the standard protocol without IV contrast.

[Series 4: lung · axial · 0.73mm/px · z∈[-204,-104]mm · 6 of 28 slices shown, 11 images]
[im 4/28  soft-tissue]
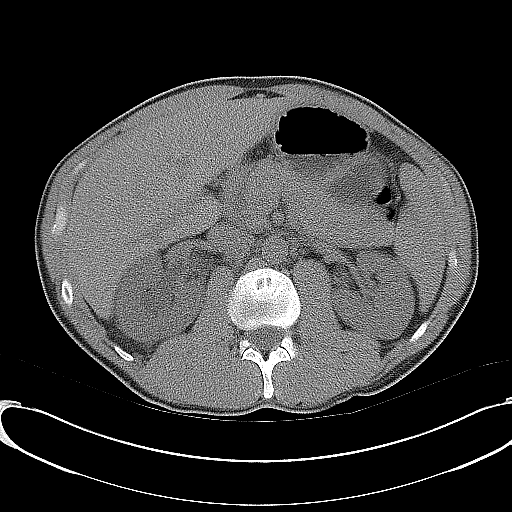
[im 4/28  bone]
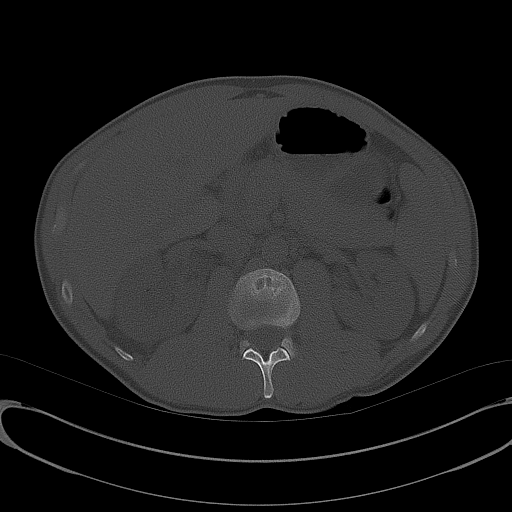
[im 8/28  soft-tissue]
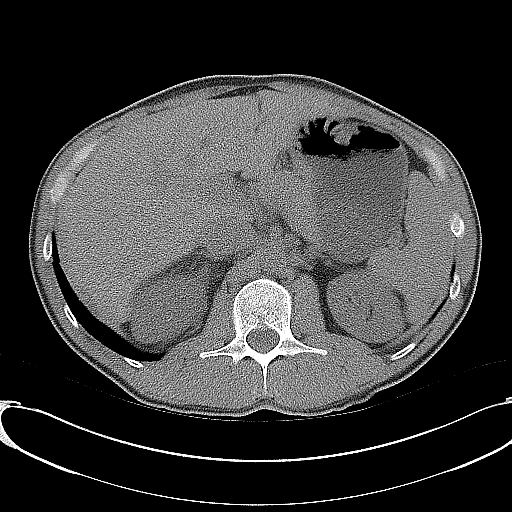
[im 12/28  soft-tissue]
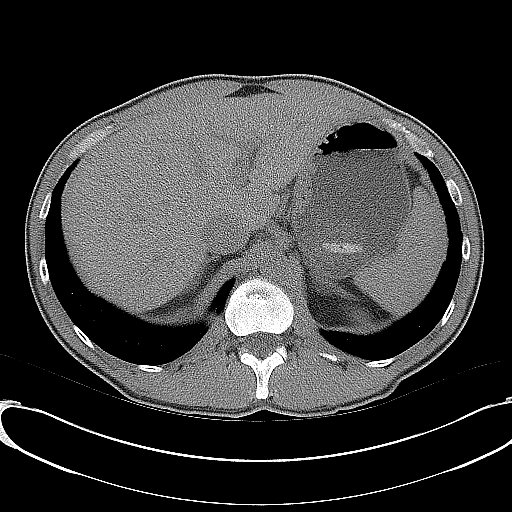
[im 12/28  lung]
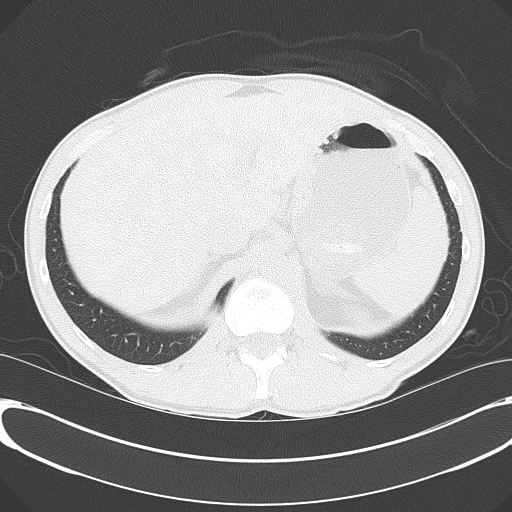
[im 16/28  soft-tissue]
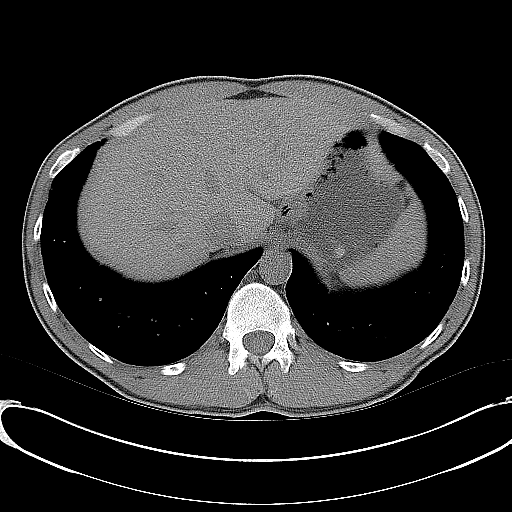
[im 16/28  lung]
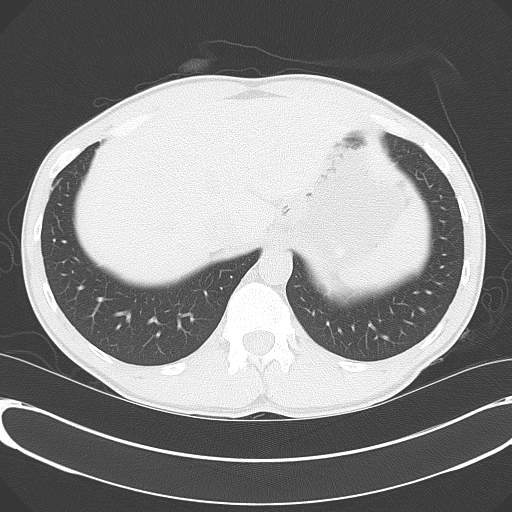
[im 20/28  soft-tissue]
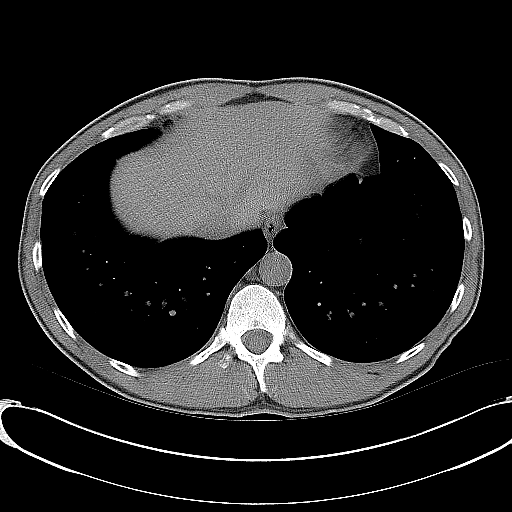
[im 20/28  lung]
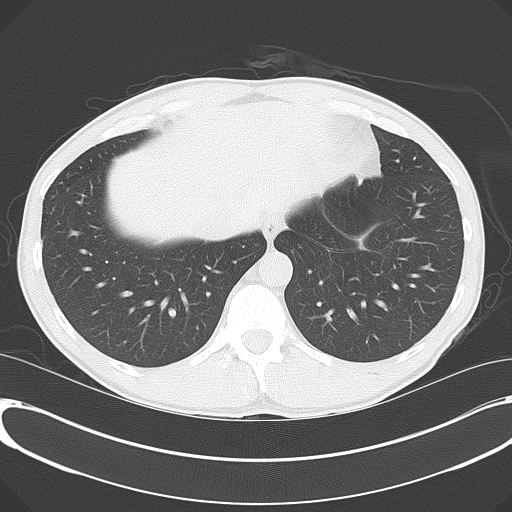
[im 24/28  soft-tissue]
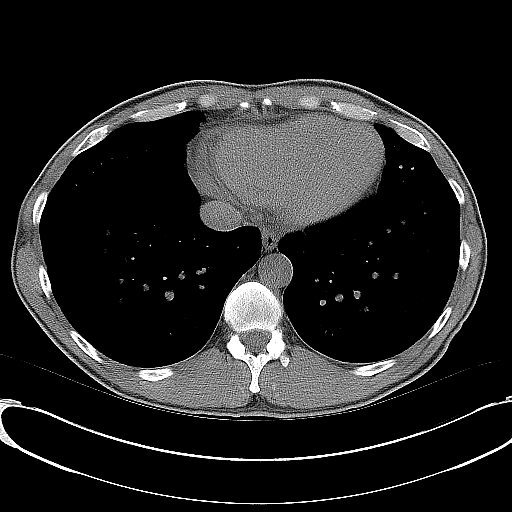
[im 24/28  lung]
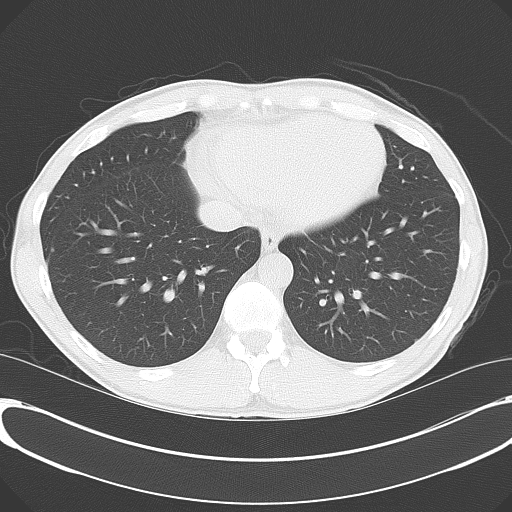

[Series 5: coronal · coronal · 0.88mm/px · 3 of 125 slices shown, 4 images]
[im 42/125  soft-tissue]
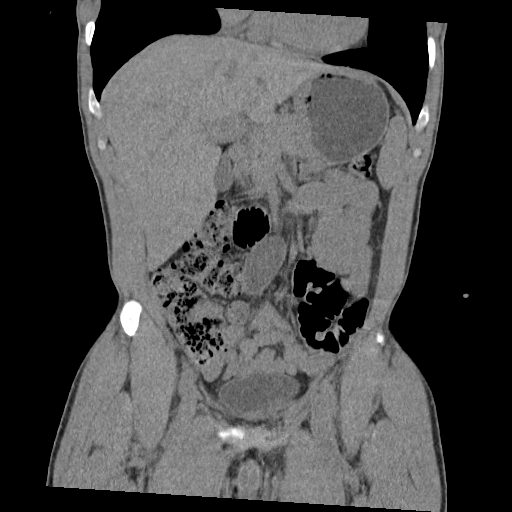
[im 56/125  soft-tissue]
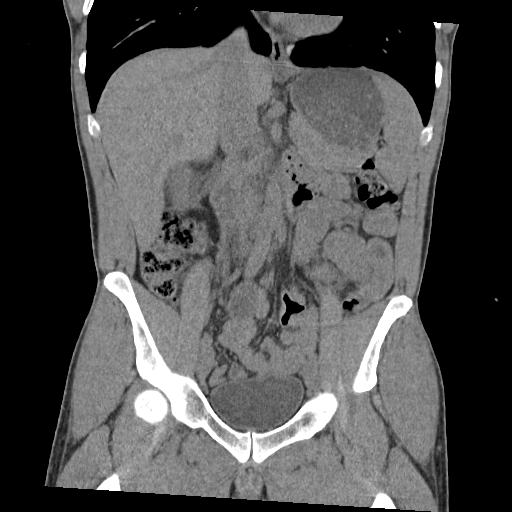
[im 56/125  bone]
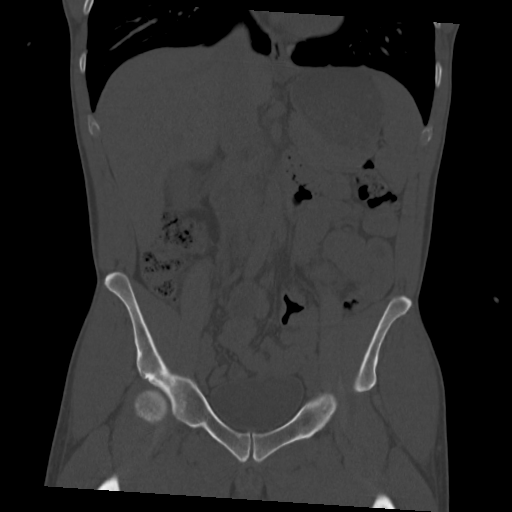
[im 69/125  soft-tissue]
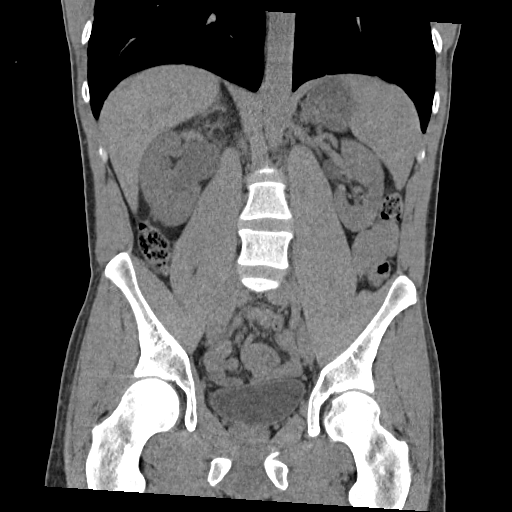

[Series 6: sagittal · sagittal · 0.92mm/px · 1 of 174 slices shown, 2 images]
[im 58/174  soft-tissue]
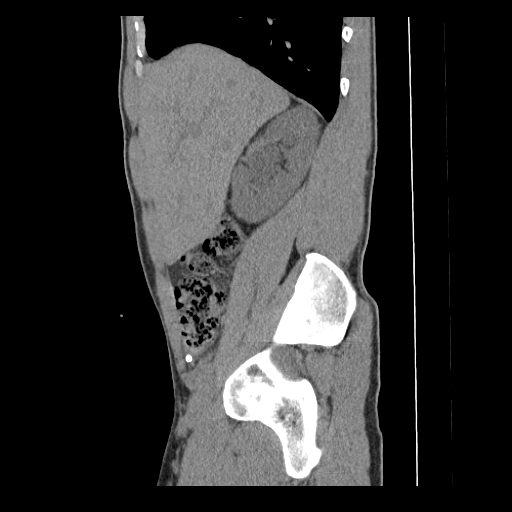
[im 58/174  bone]
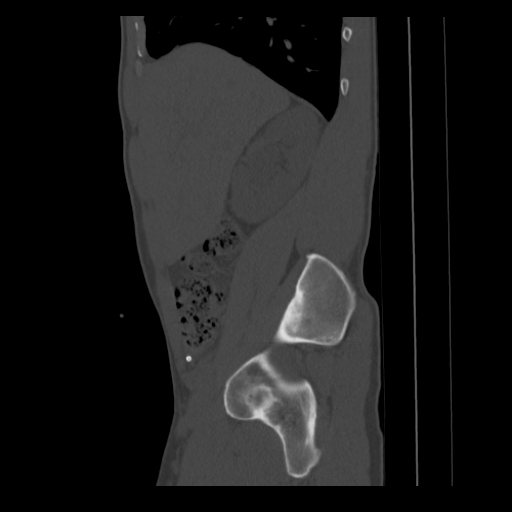

[10 of 46 positions shown; findings below may reference images not displayed]

FINDINGS: Lung bases are clear.

Two stones are demonstrated in the distal right ureter just above
the ureterovesical junction. Largest stone measures 5 mm diameter.
There is proximal hydronephrosis and hydroureter with stranding
around the right kidney in ureter. Two stones demonstrated in the
lower pole left kidney without evidence of obstruction. No left
ureteral stones demonstrated. Bladder wall is not thickened and no
bladder stones are identified.

The unenhanced appearance of the liver, spleen, gallbladder,
pancreas, adrenal glands, abdominal aorta, inferior vena cava, and
retroperitoneal lymph nodes is unremarkable. Stomach, small bowel,
and colon are not abnormally distended. Stool fills the colon. No
free air or free fluid in the abdomen.

Pelvis: Postoperative changes consistent with bilateral hernia
repairs. Prostate gland is not enlarged. Appendix is normal. No free
or loculated pelvic fluid collections. No pelvic mass or
lymphadenopathy. No destructive bone lesions.
IMPRESSION: Two stones in the distal right ureter, largest 5 mm diameter.
Proximal obstruction of the right ureter and collecting system. 2
small nonobstructing stones demonstrated in the left kidney.

## 2016-03-02 ENCOUNTER — Telehealth: Payer: Self-pay | Admitting: Family

## 2016-03-02 NOTE — Telephone Encounter (Signed)
Pt was a former Environmental consultant pt and switched Andrew Vazquez pt states he does not need a appt at this time. Thank you!

## 2016-06-13 ENCOUNTER — Telehealth: Payer: Self-pay | Admitting: Family

## 2016-06-13 DIAGNOSIS — Z800 Family history of malignant neoplasm of digestive organs: Secondary | ICD-10-CM

## 2016-06-13 NOTE — Telephone Encounter (Signed)
Please advise 

## 2016-06-13 NOTE — Telephone Encounter (Signed)
Pt lvm requesting a referral for colonoscopy.He states that his father passed 69months ago from colon cancer and he was told that since he has a f/h of colon cancer he needs to start having colonoscopy's at age 41.

## 2016-06-14 ENCOUNTER — Encounter: Payer: Self-pay | Admitting: Family

## 2016-06-16 ENCOUNTER — Other Ambulatory Visit: Payer: Self-pay

## 2016-06-16 ENCOUNTER — Telehealth: Payer: Self-pay

## 2016-06-16 DIAGNOSIS — Z800 Family history of malignant neoplasm of digestive organs: Secondary | ICD-10-CM

## 2016-06-16 NOTE — Telephone Encounter (Signed)
Gastroenterology Pre-Procedure Review  Request Date:  Requesting Physician: Dr.   PATIENT REVIEW QUESTIONS: The patient responded to the following health history questions as indicated:    1. Are you having any GI issues? no 2. Do you have a personal history of Polyps? no 3. Do you have a family history of Colon Cancer or Polyps? yes (Father, colon cancer) 4. Diabetes Mellitus? no 5. Joint replacements in the past 12 months?no 6. Major health problems in the past 3 months?no 7. Any artificial heart valves, MVP, or defibrillator?no    MEDICATIONS & ALLERGIES:    Patient reports the following regarding taking any anticoagulation/antiplatelet therapy:   Plavix, Coumadin, Eliquis, Xarelto, Lovenox, Pradaxa, Brilinta, or Effient? no Aspirin? no  Patient confirms/reports the following medications:  Current Outpatient Prescriptions  Medication Sig Dispense Refill  . HYDROcodone-acetaminophen (NORCO/VICODIN) 5-325 MG per tablet Take 1 tablet by mouth every 6 (six) hours as needed for moderate pain. 30 tablet 0  . ondansetron (ZOFRAN ODT) 4 MG disintegrating tablet Take 1 tablet (4 mg total) by mouth every 6 (six) hours as needed for nausea or vomiting. 20 tablet 0  . ondansetron (ZOFRAN) 4 MG tablet Take 1 tablet (4 mg total) by mouth every 8 (eight) hours as needed for nausea or vomiting. 20 tablet 0  . tamsulosin (FLOMAX) 0.4 MG CAPS capsule Take 1 capsule (0.4 mg total) by mouth daily. 30 capsule 0   No current facility-administered medications for this visit.     Patient confirms/reports the following allergies:  No Known Allergies  No orders of the defined types were placed in this encounter.   AUTHORIZATION INFORMATION Primary Insurance: 1D#: Group #:  Secondary Insurance: 1D#: Group #:  SCHEDULE INFORMATION: Date: 06/22/16 Time: Location: [facility]

## 2016-06-20 ENCOUNTER — Other Ambulatory Visit: Payer: Self-pay

## 2016-06-20 ENCOUNTER — Encounter: Payer: Self-pay | Admitting: *Deleted

## 2016-06-20 DIAGNOSIS — Z1211 Encounter for screening for malignant neoplasm of colon: Secondary | ICD-10-CM

## 2016-06-20 MED ORDER — NA SULFATE-K SULFATE-MG SULF 17.5-3.13-1.6 GM/177ML PO SOLN: 1.0000 | ORAL | 0 refills | Status: DC

## 2016-06-20 NOTE — Discharge Instructions (Signed)

## 2016-06-21 ENCOUNTER — Telehealth: Payer: Self-pay | Admitting: Gastroenterology

## 2016-06-21 NOTE — Telephone Encounter (Signed)
06/21/16 [employer] website Approval for Screening Colonoscopy 45378 / Z12.11 Auth #: [id number].

## 2016-06-22 ENCOUNTER — Ambulatory Visit: Payer: 59 | Admitting: Anesthesiology

## 2016-06-22 ENCOUNTER — Ambulatory Visit
Admission: RE | Admit: 2016-06-22 | Discharge: 2016-06-22 | Disposition: A | Discharge status: Home / Self Care | Payer: 59 | Source: Ambulatory Visit | Attending: Gastroenterology | Admitting: Gastroenterology

## 2016-06-22 ENCOUNTER — Encounter: Admission: RE | Payer: Self-pay | Source: Ambulatory Visit

## 2016-06-22 ENCOUNTER — Ambulatory Visit: Admission: RE | Admit: 2016-06-22 | Payer: 59 | Source: Ambulatory Visit | Admitting: Gastroenterology

## 2016-06-22 ENCOUNTER — Encounter
Admission: RE | Disposition: A | Discharge status: Home / Self Care | Payer: Self-pay | Source: Ambulatory Visit | Attending: Gastroenterology

## 2016-06-22 DIAGNOSIS — Z1211 Encounter for screening for malignant neoplasm of colon: Secondary | ICD-10-CM | POA: Diagnosis not present

## 2016-06-22 DIAGNOSIS — Z800 Family history of malignant neoplasm of digestive organs: Secondary | ICD-10-CM

## 2016-06-22 DIAGNOSIS — Z87442 Personal history of urinary calculi: Secondary | ICD-10-CM | POA: Insufficient documentation

## 2016-06-22 DIAGNOSIS — F17210 Nicotine dependence, cigarettes, uncomplicated: Secondary | ICD-10-CM | POA: Diagnosis not present

## 2016-06-22 HISTORY — DX: Presence of spectacles and contact lenses: Z97.3

## 2016-06-22 HISTORY — DX: Calculus of kidney: N20.0

## 2016-06-22 HISTORY — PX: COLONOSCOPY WITH PROPOFOL: SHX5780

## 2016-06-22 SURGERY — COLONOSCOPY WITH PROPOFOL
Anesthesia: Monitor Anesthesia Care | Wound class: Clean Contaminated

## 2016-06-22 SURGERY — COLONOSCOPY WITH PROPOFOL
Anesthesia: General

## 2016-06-22 MED ORDER — ACETAMINOPHEN 160 MG/5ML PO SOLN: 325.0000 mg | ORAL | Status: DC | PRN

## 2016-06-22 MED ORDER — LIDOCAINE HCL (CARDIAC) 20 MG/ML IV SOLN
INTRAVENOUS | Status: DC | PRN
  Administered 2016-06-22: 50 mg via INTRAVENOUS

## 2016-06-22 MED ORDER — PROPOFOL 10 MG/ML IV BOLUS
INTRAVENOUS | Status: DC | PRN
  Administered 2016-06-22 (×4): 50 mg via INTRAVENOUS

## 2016-06-22 MED ORDER — LACTATED RINGERS IV SOLN
INTRAVENOUS | Status: DC
  Administered 2016-06-22: 09:00:00 via INTRAVENOUS

## 2016-06-22 MED ORDER — ACETAMINOPHEN 325 MG PO TABS: 325.0000 mg | ORAL_TABLET | ORAL | Status: DC | PRN

## 2016-06-22 SURGICAL SUPPLY — 23 items
CANISTER SUCT 1200ML W/VALVE (MISCELLANEOUS) ×3 IMPLANT
CLIP HMST 235XBRD CATH ROT (MISCELLANEOUS) IMPLANT
CLIP RESOLUTION 360 11X235 (MISCELLANEOUS)
FCP ESCP3.2XJMB 240X2.8X (MISCELLANEOUS)
FORCEPS BIOP RAD 4 LRG CAP 4 (CUTTING FORCEPS) IMPLANT
FORCEPS BIOP RJ4 240 W/NDL (MISCELLANEOUS)
FORCEPS ESCP3.2XJMB 240X2.8X (MISCELLANEOUS) IMPLANT
GOWN CVR UNV OPN BCK APRN NK (MISCELLANEOUS) ×2 IMPLANT
GOWN ISOL THUMB LOOP REG UNIV (MISCELLANEOUS) ×6
INJECTOR VARIJECT VIN23 (MISCELLANEOUS) IMPLANT
KIT DEFENDO VALVE AND CONN (KITS) IMPLANT
KIT ENDO PROCEDURE OLY (KITS) ×3 IMPLANT
MARKER SPOT ENDO TATTOO 5ML (MISCELLANEOUS) IMPLANT
PAD GROUND ADULT SPLIT (MISCELLANEOUS) IMPLANT
PROBE APC STR FIRE (PROBE) IMPLANT
RETRIEVER NET ROTH 2.5X230 LF (MISCELLANEOUS) IMPLANT
SNARE SHORT THROW 13M SML OVAL (MISCELLANEOUS) IMPLANT
SNARE SHORT THROW 30M LRG OVAL (MISCELLANEOUS) IMPLANT
SNARE SNG USE RND 15MM (INSTRUMENTS) IMPLANT
SPOT EX ENDOSCOPIC TATTOO (MISCELLANEOUS)
TRAP ETRAP POLY (MISCELLANEOUS) IMPLANT
VARIJECT INJECTOR VIN23 (MISCELLANEOUS)
WATER STERILE IRR 250ML POUR (IV SOLUTION) ×3 IMPLANT

## 2016-06-22 NOTE — Transfer of Care (Signed)
Immediate Anesthesia Transfer of Care Note  Patient: Andrew Vazquez  Procedure(s) Performed: Procedure(s): COLONOSCOPY WITH PROPOFOL (N/A)  Patient Location: PACU  Anesthesia Type: MAC  Level of Consciousness: awake, alert  and patient cooperative  Airway and Oxygen Therapy: Patient Spontanous Breathing and Patient connected to supplemental oxygen  Post-op Assessment: Post-op Vital signs reviewed, Patient's Cardiovascular Status Stable, Respiratory Function Stable, Patent Airway and No signs of Nausea or vomiting  Post-op Vital Signs: Reviewed and stable  Complications: No apparent anesthesia complications

## 2016-06-22 NOTE — Anesthesia Preprocedure Evaluation (Signed)
Anesthesia Evaluation  Patient identified by MRN, date of birth, ID band Patient awake    Reviewed: Allergy & Precautions, H&P , NPO status , Patient's Chart, lab work & pertinent test results  Airway Mallampati: II  TM Distance: >3 FB Neck ROM: full    Dental no notable dental hx.    Pulmonary Current Smoker,    Pulmonary exam normal        Cardiovascular Normal cardiovascular exam     Neuro/Psych    GI/Hepatic   Endo/Other    Renal/GU      Musculoskeletal   Abdominal   Peds  Hematology   Anesthesia Other Findings   Reproductive/Obstetrics                             Anesthesia Physical Anesthesia Plan  ASA: II  Anesthesia Plan: MAC   Post-op Pain Management:    Induction:   Airway Management Planned:   Additional Equipment:   Intra-op Plan:   Post-operative Plan:   Informed Consent: I have reviewed the patients History and Physical, chart, labs and discussed the procedure including the risks, benefits and alternatives for the proposed anesthesia with the patient or authorized representative who has indicated his/her understanding and acceptance.     Plan Discussed with:   Anesthesia Plan Comments:         Anesthesia Quick Evaluation

## 2016-06-22 NOTE — Anesthesia Procedure Notes (Signed)
Procedure Name: MAC Performed by: Dajour Pierpoint Pre-anesthesia Checklist: Patient identified, Emergency Drugs available, Suction available, Timeout performed and Patient being monitored Patient Re-evaluated:Patient Re-evaluated prior to inductionOxygen Delivery Method: Nasal cannula Placement Confirmation: positive ETCO2     

## 2016-06-22 NOTE — H&P (Signed)
   Andrew Lame, MD [facility] [address], [place], [place] [address] Phone:[phone] Fax : [phone]  Primary Care Physician:  Andrew Paris, FNP Primary Gastroenterologist:  Dr. Allen Norris  Pre-Procedure History & Physical: HPI:  Andrew Vazquez is a 41 y.o. male is here for an colonoscopy.   Past Medical History:  Diagnosis Date  . Hernia, inguinal, unilateral   . Kidney stones   . Renal disorder   . Wears contact lenses     Past Surgical History:  Procedure Laterality Date  . LAPAROSCOPIC INGUINAL HERNIA REPAIR Bilateral 02/08/2011   Dr. Burt Knack, - [facility]  . WISDOM TOOTH EXTRACTION      Prior to Admission medications   Medication Sig Start Date End Date Taking? Authorizing Provider  Na Sulfate-K Sulfate-Mg Sulf (SUPREP BOWEL PREP KIT) 17.5-3.13-1.6 GM/180ML SOLN Take 1 kit by mouth as directed. 06/20/16   Andrew Lame, MD    Allergies as of 06/20/2016  . (No Known Allergies)    Family History  Problem Relation Age of Onset  . Cancer Father 45    Colon    Social History   Social History  . Marital status: Married    Spouse name: N/A  . Number of children: N/A  . Years of education: N/A   Occupational History  . Not on file.   Social History Main Topics  . Smoking status: Current Every Day Smoker    Packs/day: 0.75    Years: 18.00    Types: Cigarettes  . Smokeless tobacco: Never Used     Comment: started while in college  . Alcohol use Yes     Comment: 1 drink/month  . Drug use: No  . Sexual activity: Not on file   Other Topics Concern  . Not on file   Social History Narrative  . No narrative on file    Review of Systems: See HPI, otherwise negative ROS  Physical Exam: Ht _0  (1.778 m)   Wt 172 lb (78 kg)   BMI 24.68 kg/m  General:   Alert,  pleasant and cooperative in NAD Head:  Normocephalic and atraumatic. Neck:  Supple; no masses or thyromegaly. Lungs:  Clear throughout to auscultation.    Heart:  Regular rate and  rhythm. Abdomen:  Soft, nontender and nondistended. Normal bowel sounds, without guarding, and without rebound.   Neurologic:  Alert and  oriented x4;  grossly normal neurologically.  Impression/Plan: Andrew Vazquez is here for an colonoscopy to be performed for family histoy of colon cancer  Risks, benefits, limitations, and alternatives regarding  colonoscopy have been reviewed with the patient.  Questions have been answered.  All parties agreeable.   Andrew Lame, MD  06/22/2016, 9:03 AM

## 2016-06-22 NOTE — Anesthesia Postprocedure Evaluation (Signed)
Anesthesia Post Note  Patient: Andrew Vazquez  Procedure(s) Performed: Procedure(s) (LRB): COLONOSCOPY WITH PROPOFOL (N/A)  Patient location during evaluation: PACU Anesthesia Type: MAC Level of consciousness: awake and alert and oriented Pain management: satisfactory to patient Vital Signs Assessment: post-procedure vital signs reviewed and stable Respiratory status: spontaneous breathing, nonlabored ventilation and respiratory function stable Cardiovascular status: blood pressure returned to baseline and stable Postop Assessment: Adequate PO intake and No signs of nausea or vomiting Anesthetic complications: no    Raliegh Ip

## 2016-06-22 NOTE — Op Note (Signed)
[facility] Gastroenterology Patient Name: Andrew Vazquez Procedure Date: 06/22/2016 9:44 AM MRN: [id number] Account #: [id number] Date of Birth: 08/19/1975 Admit Type: Outpatient Age: 41 Room: [facility] OR ROOM 01 Gender: Male Note Status: Finalized Procedure:            Colonoscopy Indications:          Screening in patient at increased risk: Family history                        of 1st-degree relative with colorectal cancer before                        age 30 years Providers:            Lucilla Lame MD, MD Referring MD:         Einar Pheasant, MD (Referring MD) Medicines:            Propofol per Anesthesia Complications:        No immediate complications. Procedure:            Pre-Anesthesia Assessment:                       - Prior to the procedure, a History and Physical was                        performed, and patient medications and allergies were                        reviewed. The patient's tolerance of previous                        anesthesia was also reviewed. The risks and benefits of                        the procedure and the sedation options and risks were                        discussed with the patient. All questions were                        answered, and informed consent was obtained. Prior                        Anticoagulants: The patient has taken no previous                        anticoagulant or antiplatelet agents. ASA Grade                        Assessment: II - A patient with mild systemic disease.                        After reviewing the risks and benefits, the patient was                        deemed in satisfactory condition to undergo the                        procedure.  After obtaining informed consent, the colonoscope was                        passed under direct vision. Throughout the procedure,                        the patient's blood pressure, pulse, and oxygen                        saturations  were monitored continuously. The [place] [id number]) was introduced through the                        anus and advanced to the the cecum, identified by                        appendiceal orifice and ileocecal valve. The                        colonoscopy was performed without difficulty. The                        patient tolerated the procedure well. The quality of                        the bowel preparation was excellent. Findings:      The perianal and digital rectal examinations were normal.      The entire examined colon appeared normal. Impression:           - The entire examined colon is normal.                       - No specimens collected. Recommendation:       - Discharge patient to home.                       - Resume previous diet.                       - Continue present medications.                       - Repeat colonoscopy in 5 years for surveillance. Procedure Code(s):    --- Professional ---                       [phone], Colonoscopy, flexible; diagnostic, including                        collection of specimen(s) by brushing or washing, when                        performed (separate procedure) Diagnosis Code(s):    --- Professional ---                       Z80.0, Family history of malignant neoplasm of                        digestive organs CPT copyright 2016 American Medical Association. All  rights reserved. The codes documented in this report are preliminary and upon coder review may  be revised to meet current compliance requirements. Lucilla Lame MD, MD 06/22/2016 10:09:17 AM This report has been signed electronically. Number of Addenda: 0 Note Initiated On: 06/22/2016 9:44 AM Scope Withdrawal Time: 0 hours 7 minutes 10 seconds  Total Procedure Duration: 0 hours 9 minutes 22 seconds       [facility]

## 2016-06-23 ENCOUNTER — Encounter: Payer: Self-pay | Admitting: Gastroenterology

## 2016-06-28 ENCOUNTER — Telehealth: Payer: Self-pay | Admitting: Family

## 2016-06-28 NOTE — Telephone Encounter (Signed)
Call pt  He never got mychart message below  Ms Gomillion,   I wanted to let you know that referral for colonoscopy has been ordered.   We will call you with an appointment.     Joycelyn Schmid, NP

## 2016-06-30 NOTE — Telephone Encounter (Signed)
Patient was notified. Patient had colonoscopy week and a half ago.

## 2018-05-20 ENCOUNTER — Emergency Department
Admission: EM | Admit: 2018-05-20 | Discharge: 2018-05-20 | Disposition: A | Discharge status: Home / Self Care | Payer: 59 | Attending: Emergency Medicine | Admitting: Emergency Medicine

## 2018-05-20 ENCOUNTER — Encounter: Payer: Self-pay | Admitting: Emergency Medicine

## 2018-05-20 ENCOUNTER — Other Ambulatory Visit: Payer: Self-pay

## 2018-05-20 ENCOUNTER — Emergency Department: Payer: 59

## 2018-05-20 DIAGNOSIS — R002 Palpitations: Secondary | ICD-10-CM | POA: Diagnosis not present

## 2018-05-20 DIAGNOSIS — R0789 Other chest pain: Secondary | ICD-10-CM | POA: Diagnosis present

## 2018-05-20 DIAGNOSIS — F17210 Nicotine dependence, cigarettes, uncomplicated: Secondary | ICD-10-CM | POA: Insufficient documentation

## 2018-05-20 LAB — BASIC METABOLIC PANEL
Anion gap: 7 (ref 5–15)
BUN: 21 mg/dL — AB (ref 6–20)
CO2: 28 mmol/L (ref 22–32)
Calcium: 9.6 mg/dL (ref 8.9–10.3)
Chloride: 104 mmol/L (ref 98–111)
Creatinine, Ser: 1.18 mg/dL (ref 0.61–1.24)
GFR calc Af Amer: >60 mL/min (ref >60)
GFR calc non Af Amer: >60 mL/min (ref >60)
Glucose, Bld: 76 mg/dL (ref 70–99)
Potassium: 4 mmol/L (ref 3.5–5.1)
Sodium: 139 mmol/L (ref 135–145)

## 2018-05-20 LAB — CBC
HCT: 45.8 % (ref 39.0–52.0)
Hemoglobin: 15.8 g/dL (ref 13.0–17.0)
MCH: 32.8 pg (ref 26.0–34.0)
MCHC: 34.5 g/dL (ref 30.0–36.0)
MCV: 95 fL (ref 80.0–100.0)
Platelets: 235 10*3/uL (ref 150–400)
RBC: 4.82 MIL/uL (ref 4.22–5.81)
RDW: 12.9 % (ref 11.5–15.5)
WBC: 7.2 10*3/uL (ref 4.0–10.5)
nRBC: 0 % (ref 0.0–0.2)

## 2018-05-20 LAB — TROPONIN I: Troponin I: <0.03 ng/mL (ref <0.03)

## 2018-05-20 MED ORDER — LORAZEPAM 1 MG PO TABS: 1.0000 mg | ORAL_TABLET | Freq: Two times a day (BID) | ORAL | 0 refills | Status: AC | PRN

## 2018-05-20 NOTE — ED Triage Notes (Signed)
states he developed right arm tingling while driving this am  And then some heart racing  States that sx had eased off   But still feeling different in chest

## 2018-05-20 NOTE — ED Provider Notes (Signed)
[facility] Emergency Department Provider Note       Time seen: ----------------------------------------- 8:02 AM on 05/20/2018 -----------------------------------------   I have reviewed the triage vital signs and the nursing notes.  HISTORY   Chief Complaint Chest Pain and Arm Pain    HPI Andrew Vazquez is a 43 y.o. male with a history of inguinal hernia, kidney stones, renal disorder who presents to the ED for right arm tingling while driving this morning.  Patient has had some heart racing but states that symptom has eased off.  He feels different in his chest, has a hollow feeling.  He denies any recent illness or other complaints.  Past Medical History:  Diagnosis Date  . Hernia, inguinal, unilateral   . Kidney stones   . Renal disorder   . Wears contact lenses     Patient Active Problem List   Diagnosis Date Noted  . Family history of malignant neoplasm of gastrointestinal tract   . Abdominal pain, epigastric 02/18/2014  . Nephrolithiasis 02/12/2014  . Abdominal pain, left lower quadrant 02/05/2014  . Family history of colon cancer 02/05/2014  . Tobacco abuse 04/10/2011    Past Surgical History:  Procedure Laterality Date  . COLONOSCOPY WITH PROPOFOL N/A 06/22/2016   Procedure: COLONOSCOPY WITH PROPOFOL;  Surgeon: Lucilla Lame, MD;  Location: [place];  Service: Endoscopy;  Laterality: N/A;  . LAPAROSCOPIC INGUINAL HERNIA REPAIR Bilateral 02/08/2011   Dr. Burt Knack, - [facility]  . WISDOM TOOTH EXTRACTION      Allergies Patient has no known allergies.  Social History Social History   Tobacco Use  . Smoking status: Current Every Day Smoker    Packs/day: 0.75    Years: 18.00    Pack years: 13.50    Types: Cigarettes  . Smokeless tobacco: Never Used  . Tobacco comment: started while in college  Substance Use Topics  . Alcohol use: Yes    Comment: 1 drink/month  . Drug use: No   Review of Systems Constitutional: Negative  for fever. Cardiovascular: Positive for chest discomfort, palpitations Respiratory: Negative for shortness of breath. Gastrointestinal: Negative for abdominal pain, vomiting and diarrhea. Musculoskeletal: Negative for back pain. Skin: Negative for rash. Neurological: Negative for headaches, positive for right arm tingling  All systems negative/normal/unremarkable except as stated in the HPI  ____________________________________________   PHYSICAL EXAM:  VITAL SIGNS: ED Triage Vitals  Enc Vitals Group     BP 05/20/18 0757 130/73     Pulse Rate 05/20/18 0757 72     Resp 05/20/18 0757 18     Temp 05/20/18 0757 98.2 F (36.8 C)     Temp src --      SpO2 05/20/18 0757 100 %     Weight 05/20/18 0755 170 lb (77.1 kg)     Height 05/20/18 0755 5\' 11"  (9.675 m)     Head Circumference --      Peak Flow --      Pain Score 05/20/18 0755 2     Pain Loc --      Pain Edu? --      Excl. in [place]? --    Constitutional: Alert and oriented. Well appearing and in no distress. Eyes: Conjunctivae are normal. Normal extraocular movements. ENT      Head: Normocephalic and atraumatic.      Nose: No congestion/rhinnorhea.      Mouth/Throat: Mucous membranes are moist.      Neck: No stridor. Cardiovascular: Normal rate, regular rhythm. No murmurs,  rubs, or gallops. Respiratory: Normal respiratory effort without tachypnea nor retractions. Breath sounds are clear and equal bilaterally. No wheezes/rales/rhonchi. Gastrointestinal: Soft and nontender. Normal bowel sounds Musculoskeletal: Nontender with normal range of motion in extremities. No lower extremity tenderness nor edema. Neurologic:  Normal speech and language. No gross focal neurologic deficits are appreciated.  Skin:  Skin is warm, dry and intact. No rash noted. Psychiatric: Mood and affect are normal. Speech and behavior are normal.  ____________________________________________  EKG: Interpreted by me.  Sinus rhythm with a rate of 69 bpm,  incomplete right bundle branch block, normal axis, normal QT  ____________________________________________  ED COURSE:  As part of my medical decision making, I reviewed the following data within the [place] History obtained from family if available, nursing notes, old chart and ekg, as well as notes from prior ED visits. Patient presented for tingling and tachycardia, we will assess with labs and imaging as indicated at this time.   Procedures Andrew Vazquez was evaluated in Emergency Department on 05/20/2018 for the symptoms described in the history of present illness. He was evaluated in the context of the global COVID-19 pandemic, which necessitated consideration that the patient might be at risk for infection with the SARS-CoV-2 virus that causes COVID-19. Institutional protocols and algorithms that pertain to the evaluation of patients at risk for COVID-19 are in a state of rapid change based on information released by regulatory bodies including the CDC and federal and state organizations. These policies and algorithms were followed during the patient's care in the ED.  ____________________________________________   LABS (pertinent positives/negatives)  Labs Reviewed  BASIC METABOLIC PANEL - Abnormal; Notable for the following components:      Result Value   BUN 21 (*)    All other components within normal limits  CBC  TROPONIN I  TROPONIN I    RADIOLOGY Images were viewed by me  Chest x-ray Is unremarkable ____________________________________________   DIFFERENTIAL DIAGNOSIS   Anxiety, musculoskeletal pain, GERD, paresthesia, unstable angina, arrhythmia  FINAL ASSESSMENT AND PLAN  Palpitations   Plan: The patient had presented for tingling and palpitations. Patient's labs are reassuring. Patient's imaging was unremarkable.  Symptoms are more consistent with panic attack than any other etiology.  He is cleared for outpatient follow-up.   Laurence Aly, MD    Note: This note was generated in part or whole with voice recognition software. Voice recognition is usually quite accurate but there are transcription errors that can and very often do occur. I apologize for any typographical errors that were not detected and corrected.     Earleen Newport, MD 05/20/18 1016

## 2018-05-20 NOTE — ED Notes (Signed)
Pt reports while driving into work this morning his right hand started tingling and started to go numb, reports he started feeling a "warmth" in his chest and his heart felt like it was racing.

## 2018-05-20 NOTE — ED Notes (Signed)
Pt was complaining he wanted IV out of arm. Spoke to [employer] and he said I could pull the IV. Pulled IV pt is resting in bed at this time.

## 2019-07-04 IMAGING — DX PORTABLE CHEST - 1 VIEW
2 series · 2 of 2 positions shown · non-contrast
Comparison: None.

CLINICAL DATA: states he developed right arm tingling while driving
this am And then some heart racing States that sx had eased off But
still feeling different in chest. No previous hx. Current everyday
smokerchest discomfort

EXAM:
PORTABLE CHEST 1 VIEW

[chest ap (1 of 2)]
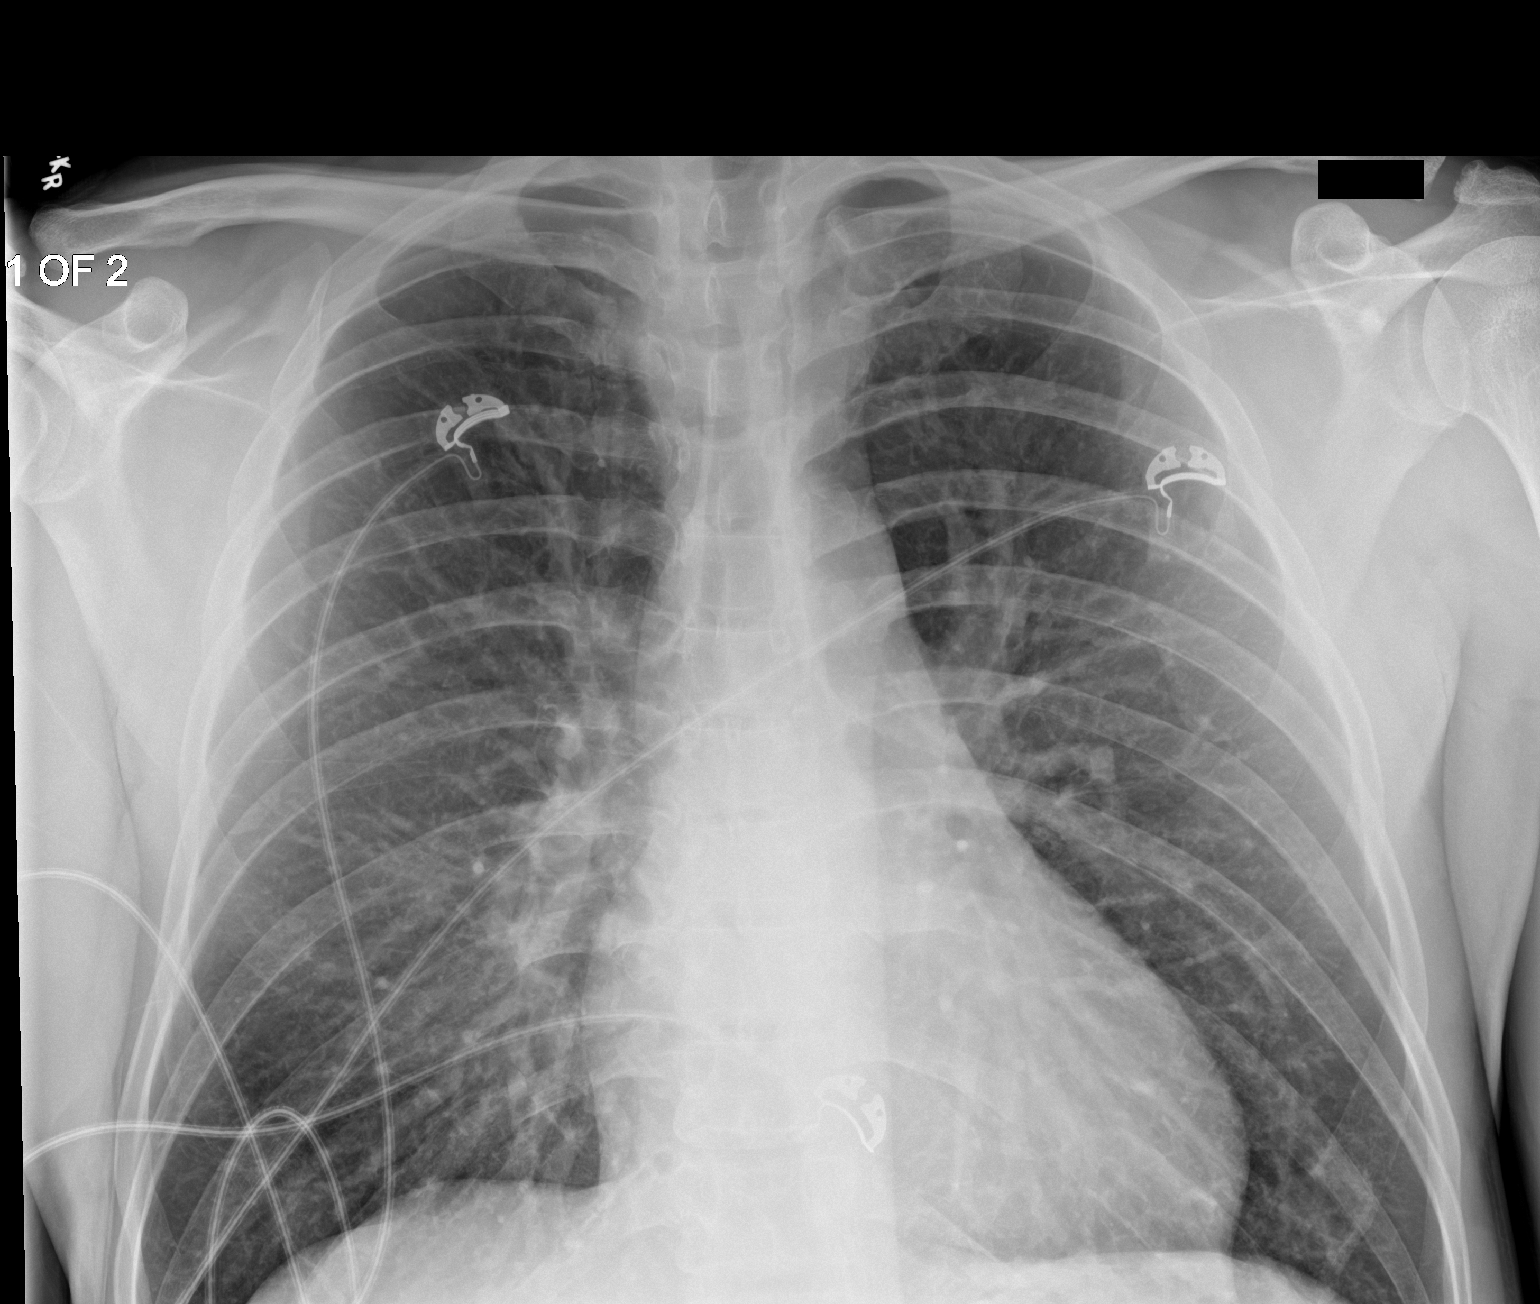

[chest ap (2 of 2)]
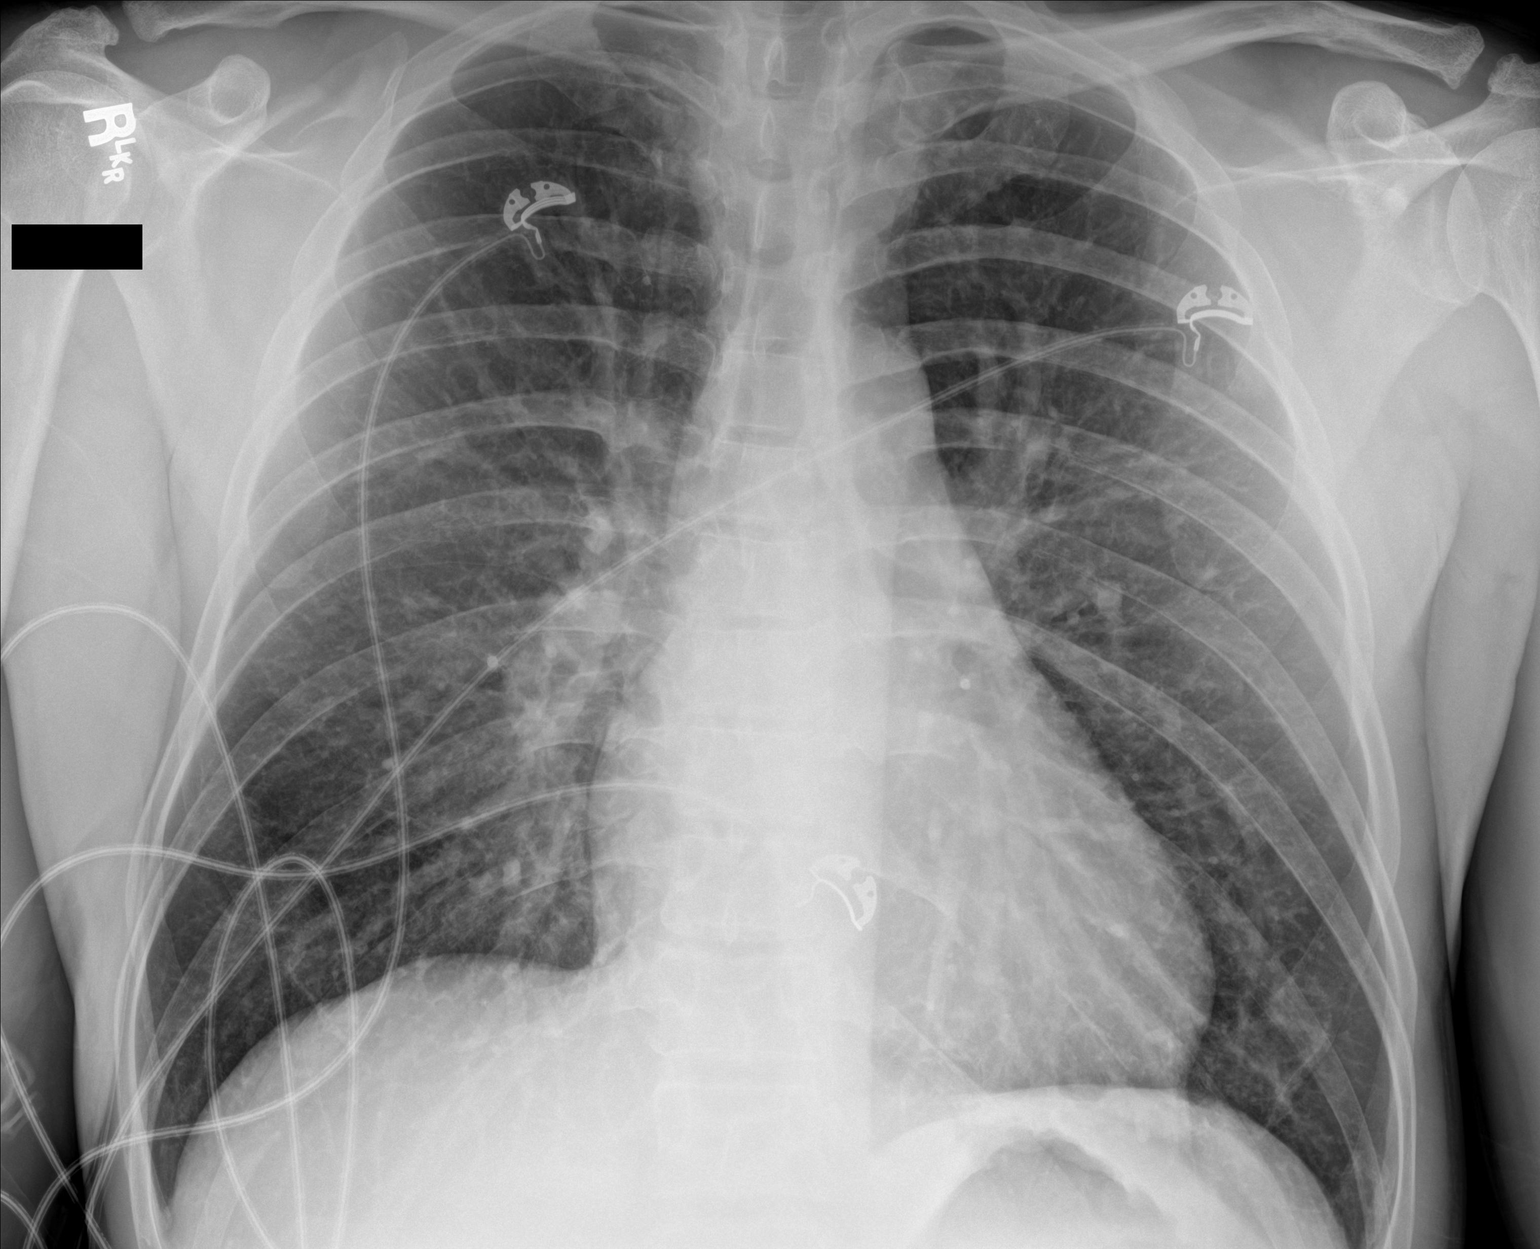

[2 of 2 positions shown; findings below may reference images not displayed]

FINDINGS: Normal mediastinum and cardiac silhouette. Normal pulmonary
vasculature. No evidence of effusion, infiltrate, or pneumothorax.
No acute bony abnormality.
IMPRESSION: No active disease.

## 2021-04-05 ENCOUNTER — Ambulatory Visit: Admission: EM | Admit: 2021-04-05 | Payer: Self-pay

## 2021-04-05 ENCOUNTER — Encounter: Payer: Self-pay | Admitting: Emergency Medicine

## 2021-04-05 ENCOUNTER — Ambulatory Visit: Admission: EM | Admit: 2021-04-05 | Discharge: 2021-04-05 | Payer: 59

## 2021-04-05 ENCOUNTER — Other Ambulatory Visit: Payer: Self-pay

## 2021-04-05 DIAGNOSIS — R1031 Right lower quadrant pain: Secondary | ICD-10-CM | POA: Insufficient documentation

## 2021-04-05 DIAGNOSIS — R100 Acute abdomen: Secondary | ICD-10-CM

## 2021-04-05 DIAGNOSIS — R079 Chest pain, unspecified: Secondary | ICD-10-CM | POA: Diagnosis not present

## 2021-04-05 DIAGNOSIS — R0789 Other chest pain: Secondary | ICD-10-CM

## 2021-04-05 DIAGNOSIS — R198 Other specified symptoms and signs involving the digestive system and abdomen: Secondary | ICD-10-CM | POA: Diagnosis not present

## 2021-04-05 DIAGNOSIS — Z5321 Procedure and treatment not carried out due to patient leaving prior to being seen by health care provider: Secondary | ICD-10-CM | POA: Diagnosis not present

## 2021-04-05 DIAGNOSIS — M549 Dorsalgia, unspecified: Secondary | ICD-10-CM | POA: Diagnosis not present

## 2021-04-05 DIAGNOSIS — F17200 Nicotine dependence, unspecified, uncomplicated: Secondary | ICD-10-CM

## 2021-04-05 LAB — POCT URINALYSIS DIP (MANUAL ENTRY)
Bilirubin, UA: NEGATIVE
Blood, UA: NEGATIVE
Glucose, UA: NEGATIVE mg/dL
Ketones, POC UA: NEGATIVE mg/dL
Leukocytes, UA: NEGATIVE
Nitrite, UA: NEGATIVE
Protein Ur, POC: NEGATIVE mg/dL
Spec Grav, UA: 1.02 (ref 1.010–1.025)
Urobilinogen, UA: 0.2 E.U./dL
pH, UA: 7 (ref 5.0–8.0)

## 2021-04-05 LAB — LIPASE, BLOOD: Lipase: 32 U/L (ref 11–51)

## 2021-04-05 LAB — URINALYSIS, ROUTINE W REFLEX MICROSCOPIC
Bilirubin Urine: NEGATIVE
Glucose, UA: 150 mg/dL — AB
Hgb urine dipstick: NEGATIVE
Ketones, ur: NEGATIVE mg/dL
Leukocytes,Ua: NEGATIVE
Nitrite: NEGATIVE
Protein, ur: NEGATIVE mg/dL
Specific Gravity, Urine: 1.012 (ref 1.005–1.030)
pH: 6 (ref 5.0–8.0)

## 2021-04-05 LAB — CBC
HCT: 43.9 % (ref 39.0–52.0)
Hemoglobin: 15.1 g/dL (ref 13.0–17.0)
MCH: 32.2 pg (ref 26.0–34.0)
MCHC: 34.4 g/dL (ref 30.0–36.0)
MCV: 93.6 fL (ref 80.0–100.0)
Platelets: 203 10*3/uL (ref 150–400)
RBC: 4.69 MIL/uL (ref 4.22–5.81)
RDW: 13.3 % (ref 11.5–15.5)
WBC: 8.3 10*3/uL (ref 4.0–10.5)
nRBC: 0 % (ref 0.0–0.2)

## 2021-04-05 LAB — TROPONIN I (HIGH SENSITIVITY)
Troponin I (High Sensitivity): 3 ng/L (ref <18)
Troponin I (High Sensitivity): 3 ng/L (ref <18)

## 2021-04-05 LAB — COMPREHENSIVE METABOLIC PANEL
ALT: 26 U/L (ref 0–44)
AST: 22 U/L (ref 15–41)
Albumin: 4.2 g/dL (ref 3.5–5.0)
Alkaline Phosphatase: 93 U/L (ref 38–126)
Anion gap: 6 (ref 5–15)
BUN: 23 mg/dL — ABNORMAL HIGH (ref 6–20)
CO2: 28 mmol/L (ref 22–32)
Calcium: 9.1 mg/dL (ref 8.9–10.3)
Chloride: 103 mmol/L (ref 98–111)
Creatinine, Ser: 1.05 mg/dL (ref 0.61–1.24)
GFR, Estimated: >60 mL/min (ref >60)
Glucose, Bld: 107 mg/dL — ABNORMAL HIGH (ref 70–99)
Potassium: 3.8 mmol/L (ref 3.5–5.1)
Sodium: 137 mmol/L (ref 135–145)
Total Bilirubin: 0.4 mg/dL (ref 0.3–1.2)
Total Protein: 7.3 g/dL (ref 6.5–8.1)

## 2021-04-05 NOTE — ED Triage Notes (Signed)
Pt presents to ER c/o RLQ abd pain that radiates to his chest that started last night when pt was sleeping.  Pt denies any relieving factors for pain, and states pain moves from his RLQ to his chest and back.  Pt denies having anything other than a hernia surgery.  Pt denies urinary symptoms.  Pt A&O x4 at this time

## 2021-04-05 NOTE — ED Provider Notes (Signed)
[name]   MRN: [id number] DOB: 1975-10-07  Subjective:   Andrew Vazquez is a 46 y.o. male presenting for 1 day history of acute onset worsening now constant moderate to severe right lower quadrant pain that radiates toward the epigastric area and the mid sternum.  The pain can become so severe that he becomes short of breath.  Has had an occasional cough but is not abnormal for him as he is a smoker, does have pack per day.  No diarrhea, constipation, bloody stools.  Patient did have a colonoscopy done in 2018 that was completely normal.  This was done due to a family history of colon cancer.  Also has a history of kidney stones last seen through CT scan in 2016.  No current facility-administered medications for this encounter. No current outpatient medications on file.   No Known Allergies  Past Medical History:  Diagnosis Date   Hernia, inguinal, unilateral    Kidney stones    Renal disorder    Wears contact lenses      Past Surgical History:  Procedure Laterality Date   COLONOSCOPY WITH PROPOFOL N/A 06/22/2016   Procedure: COLONOSCOPY WITH PROPOFOL;  Surgeon: Lucilla Lame, MD;  Location: [place];  Service: Endoscopy;  Laterality: N/A;   LAPAROSCOPIC INGUINAL HERNIA REPAIR Bilateral 02/08/2011   Dr. Burt Knack, - [facility]   WISDOM TOOTH EXTRACTION      Family History  Problem Relation Age of Onset   Cancer Father 90       Colon    Social History   Tobacco Use   Smoking status: Every Day    Packs/day: 0.75    Years: 18.00    Pack years: 13.50    Types: Cigarettes   Smokeless tobacco: Never   Tobacco comments:    started while in college  Substance Use Topics   Alcohol use: Yes    Comment: 1 drink/month   Drug use: No    ROS   Objective:   Vitals: BP 130/79 (BP Location: Left Arm)    Pulse 66    Temp 99.8 F (37.7 C) (Oral)    Resp 18    SpO2 96%   Physical Exam Constitutional:      General: He is not in acute distress.     Appearance: Normal appearance. He is well-developed and normal weight. He is not ill-appearing, toxic-appearing or diaphoretic.  HENT:     Head: Normocephalic and atraumatic.     Right Ear: External ear normal.     Left Ear: External ear normal.     Nose: Nose normal.     Mouth/Throat:     Mouth: Mucous membranes are moist.     Pharynx: Oropharynx is clear.  Eyes:     General: No scleral icterus.       Right eye: No discharge.        Left eye: No discharge.     Extraocular Movements: Extraocular movements intact.  Cardiovascular:     Rate and Rhythm: Normal rate and regular rhythm.     Heart sounds: Normal heart sounds. No murmur heard.   No friction rub. No gallop.  Pulmonary:     Effort: Pulmonary effort is normal. No respiratory distress.     Breath sounds: Normal breath sounds. No stridor. No wheezing, rhonchi or rales.  Abdominal:     General: Bowel sounds are normal. There is no distension.     Palpations: Abdomen is soft. There is no mass.  Tenderness: There is abdominal tenderness in the right lower quadrant. There is guarding (right sided). There is no right CVA tenderness, left CVA tenderness or rebound.  Musculoskeletal:     Cervical back: Normal range of motion.  Neurological:     Mental Status: He is alert and oriented to person, place, and time.  Psychiatric:        Mood and Affect: Mood normal.        Behavior: Behavior normal.        Thought Content: Thought content normal.        Judgment: Judgment normal.   ED ECG REPORT   Date: 04/05/2021  EKG Time: 7:23 PM  Rate: 61bpm  Rhythm: normal sinus rhythm,  unchanged from previous tracings  Axis: Rightward  Intervals:none  ST&T Change: T wave flattening in lead aVL  Narrative Interpretation: Sinus rhythm at 61 bpm with nonspecific T wave change.  Suspect that that abnormal ST wave is a PVC in lead V3.  Comparable to previous EKG otherwise.   Results for orders placed or performed during the hospital  encounter of 04/05/21 (from the past 24 hour(s))  POCT urinalysis dipstick     Status: None   Collection Time: 04/05/21  7:27 PM  Result Value Ref Range   Color, UA yellow yellow   Clarity, UA clear clear   Glucose, UA negative negative mg/dL   Bilirubin, UA negative negative   Ketones, POC UA negative negative mg/dL   Spec Grav, UA 1.020 1.010 - 1.025   Blood, UA negative negative   pH, UA 7.0 5.0 - 8.0   Protein Ur, POC negative negative mg/dL   Urobilinogen, UA 0.2 0.2 or 1.0 E.U./dL   Nitrite, UA Negative Negative   Leukocytes, UA Negative Negative    Assessment and Plan :   PDMP not reviewed this encounter.  1. Right lower quadrant pain   2. Acute abdomen   3. Abdominal guarding   4. Midsternal chest pain   5. Smoker    I am concerned the patient is having an acute abdomen was at the differential including acute appendicitis, colitis, diverticulitis, obstructive uropathy despite no hematuria on urinalysis.  Emphasized need for further evaluation and intervention through the emergency room for consideration of a CT scan of the abdomen pelvis.  Patient is in agreement, will head straight there.   Jaynee Eagles, [place] 04/05/21 1938

## 2021-04-05 NOTE — Discharge Instructions (Signed)
I am concerned that you have an acute abdomen and will need a CT scan to visualize the abdomen internally. Please head straight to the hospital now for further testing and intervention of your severe abdominal pain.

## 2021-04-05 NOTE — ED Notes (Signed)
Pt had EKG done at [facility] at 1917, no EKG at this time per Dr. Jacqualine Code.

## 2021-04-05 NOTE — ED Triage Notes (Signed)
Pt presents with a hot pressure feeling in his abdomen that radiates up to his chest that started last night.

## 2021-04-06 ENCOUNTER — Other Ambulatory Visit: Payer: Self-pay | Admitting: Family Medicine

## 2021-04-06 ENCOUNTER — Emergency Department
Admission: EM | Admit: 2021-04-06 | Discharge: 2021-04-06 | Discharge status: Against Medical Advice | Payer: 59 | Attending: Emergency Medicine | Admitting: Emergency Medicine

## 2021-04-06 ENCOUNTER — Other Ambulatory Visit: Payer: Self-pay

## 2021-04-06 ENCOUNTER — Ambulatory Visit
Admission: RE | Admit: 2021-04-06 | Discharge: 2021-04-06 | Disposition: A | Discharge status: Home / Self Care | Payer: 59 | Source: Ambulatory Visit | Attending: Family Medicine | Admitting: Family Medicine

## 2021-04-06 DIAGNOSIS — R1031 Right lower quadrant pain: Secondary | ICD-10-CM

## 2021-04-06 MED ORDER — IOHEXOL 300 MG/ML  SOLN
100.0000 mL | Freq: Once | INTRAMUSCULAR | Status: AC | PRN
  Administered 2021-04-06: 100 mL via INTRAVENOUS

## 2021-07-20 ENCOUNTER — Telehealth: Payer: Self-pay

## 2021-07-20 NOTE — Telephone Encounter (Signed)
Pt needs to schedule his 5 year repeat colonoscopy. Last procedure was with Dr. Allen Norris. Please call and schedule.

## 2021-07-21 ENCOUNTER — Ambulatory Visit: Payer: 59 | Admitting: Gastroenterology

## 2021-07-21 ENCOUNTER — Telehealth: Payer: Self-pay

## 2021-07-21 ENCOUNTER — Other Ambulatory Visit: Payer: Self-pay

## 2021-07-21 DIAGNOSIS — Z1211 Encounter for screening for malignant neoplasm of colon: Secondary | ICD-10-CM

## 2021-07-21 MED ORDER — PEG 3350-KCL-NA BICARB-NACL 420 G PO SOLR: 4000.0000 mL | Freq: Once | ORAL | 0 refills | Status: AC

## 2021-07-21 NOTE — Telephone Encounter (Signed)
Accidentally called Mr. Dygert.  My coworker had already called him back to schedule his repeat colonoscopy.  Thanks,  [place], [place]

## 2021-07-21 NOTE — Progress Notes (Signed)
Gastroenterology Pre-Procedure Review  Request Date: 08/30/2021 Requesting Physician: Dr. Allen Norris  PATIENT REVIEW QUESTIONS: The patient responded to the following health history questions as indicated:    1. Are you having any GI issues? no 2. Do you have a personal history of Polyps? no 3. Do you have a family history of Colon Cancer or Polyps? yes (colon cancer) 4. Diabetes Mellitus? no 5. Joint replacements in the past 12 months?no 6. Major health problems in the past 3 months?no 7. Any artificial heart valves, MVP, or defibrillator?no    MEDICATIONS & ALLERGIES:    Patient reports the following regarding taking any anticoagulation/antiplatelet therapy:   Plavix, Coumadin, Eliquis, Xarelto, Lovenox, Pradaxa, Brilinta, or Effient? no Aspirin? no  Patient confirms/reports the following medications:  Current Outpatient Medications  Medication Sig Dispense Refill   escitalopram (LEXAPRO) 10 MG tablet Take 10 mg by mouth daily.     meloxicam (MOBIC) 15 MG tablet meloxicam 15 mg tablet  TAKE 1 TABLET BY MOUTH EVERY DAY     No current facility-administered medications for this visit.    Patient confirms/reports the following allergies:  No Known Allergies  No orders of the defined types were placed in this encounter.   AUTHORIZATION INFORMATION Primary Insurance: 1D#: Group #:  Secondary Insurance: 1D#: Group #:  SCHEDULE INFORMATION: Date: 08/30/2021 Time: Location:[facility]

## 2021-08-30 ENCOUNTER — Ambulatory Visit
Admission: RE | Admit: 2021-08-30 | Discharge: 2021-08-30 | Disposition: A | Discharge status: Home / Self Care | Payer: 59 | Attending: Gastroenterology | Admitting: Gastroenterology

## 2021-08-30 ENCOUNTER — Encounter: Payer: Self-pay | Admitting: Gastroenterology

## 2021-08-30 ENCOUNTER — Ambulatory Visit: Payer: 59 | Admitting: Anesthesiology

## 2021-08-30 ENCOUNTER — Encounter
Admission: RE | Disposition: A | Discharge status: Home / Self Care | Payer: Self-pay | Source: Home / Self Care | Attending: Gastroenterology

## 2021-08-30 ENCOUNTER — Other Ambulatory Visit: Payer: Self-pay

## 2021-08-30 DIAGNOSIS — Z8719 Personal history of other diseases of the digestive system: Secondary | ICD-10-CM | POA: Diagnosis not present

## 2021-08-30 DIAGNOSIS — Z1211 Encounter for screening for malignant neoplasm of colon: Secondary | ICD-10-CM | POA: Diagnosis present

## 2021-08-30 DIAGNOSIS — F17210 Nicotine dependence, cigarettes, uncomplicated: Secondary | ICD-10-CM | POA: Diagnosis not present

## 2021-08-30 DIAGNOSIS — D122 Benign neoplasm of ascending colon: Secondary | ICD-10-CM | POA: Diagnosis not present

## 2021-08-30 DIAGNOSIS — Z87442 Personal history of urinary calculi: Secondary | ICD-10-CM | POA: Diagnosis not present

## 2021-08-30 DIAGNOSIS — Z800 Family history of malignant neoplasm of digestive organs: Secondary | ICD-10-CM | POA: Diagnosis not present

## 2021-08-30 HISTORY — PX: COLONOSCOPY WITH PROPOFOL: SHX5780

## 2021-08-30 HISTORY — DX: Depression, unspecified: F32.A

## 2021-08-30 SURGERY — COLONOSCOPY WITH PROPOFOL
Anesthesia: General

## 2021-08-30 MED ORDER — PROPOFOL 500 MG/50ML IV EMUL
INTRAVENOUS | Status: DC | PRN
  Administered 2021-08-30: 165 ug/kg/min via INTRAVENOUS

## 2021-08-30 MED ORDER — LIDOCAINE HCL (CARDIAC) PF 100 MG/5ML IV SOSY
PREFILLED_SYRINGE | INTRAVENOUS | Status: DC | PRN
  Administered 2021-08-30: 100 mg via INTRAVENOUS

## 2021-08-30 MED ORDER — DEXMEDETOMIDINE HCL IN NACL 200 MCG/50ML IV SOLN
INTRAVENOUS | Status: DC | PRN
  Administered 2021-08-30: 8 ug via INTRAVENOUS

## 2021-08-30 MED ORDER — SODIUM CHLORIDE 0.9 % IV SOLN: INTRAVENOUS | Status: DC

## 2021-08-30 MED ORDER — PROPOFOL 10 MG/ML IV BOLUS
INTRAVENOUS | Status: DC | PRN
  Administered 2021-08-30: 10 mg via INTRAVENOUS
  Administered 2021-08-30: 20 mg via INTRAVENOUS
  Administered 2021-08-30: 70 mg via INTRAVENOUS

## 2021-08-30 NOTE — H&P (Signed)
Lucilla Lame, MD [address] [place], [place] [address] [phone] Fax : [phone]  Primary Care Physician:  Donnamarie Rossetti, PA-C Primary Gastroenterologist:  Dr. Allen Norris  Pre-Procedure History & Physical: HPI:  Andrew Vazquez is a 46 y.o. male is here for an colonoscopy.   Past Medical History:  Diagnosis Date   Depression    Hernia, inguinal, unilateral    Kidney stones    Renal disorder    Wears contact lenses     Past Surgical History:  Procedure Laterality Date   COLONOSCOPY WITH PROPOFOL N/A 06/22/2016   Procedure: COLONOSCOPY WITH PROPOFOL;  Surgeon: Lucilla Lame, MD;  Location: [place];  Service: Endoscopy;  Laterality: N/A;   LAPAROSCOPIC INGUINAL HERNIA REPAIR Bilateral 02/08/2011   Dr. Burt Knack, - [place]      Prior to Admission medications   Medication Sig Start Date End Date Taking? Authorizing Provider  escitalopram (LEXAPRO) 10 MG tablet Take 10 mg by mouth daily. 03/14/21  Yes [provider]  meloxicam (MOBIC) 15 MG tablet meloxicam 15 mg tablet  TAKE 1 TABLET BY MOUTH EVERY DAY 12/25/20   [provider]    Allergies as of 07/21/2021   (No Known Allergies)    Family History  Problem Relation Age of Onset   Cancer Father 63       Colon    Social History   Socioeconomic History   Marital status: Married    Spouse name: Not on file   Number of children: Not on file   Years of education: Not on file   Highest education level: Not on file  Occupational History   Not on file  Tobacco Use   Smoking status: Every Day    Packs/day: 0.75    Years: 18.00    Total pack years: 13.50    Types: Cigarettes   Smokeless tobacco: Never   Tobacco comments:    started while in college  Vaping Use   Vaping Use: Never used  Substance and Sexual Activity   Alcohol use: Yes    Comment: 1 drink/month,none last 24hrs   Drug use: No   Sexual activity: Not on file  Other  Topics Concern   Not on file  Social History Narrative   Not on file   Social Determinants of Health   Financial Resource Strain: Not on file  Food Insecurity: Not on file  Transportation Needs: Not on file  Physical Activity: Not on file  Stress: Not on file  Social Connections: Not on file  Intimate Partner Violence: Not on file    Review of Systems: See HPI, otherwise negative ROS  Physical Exam: BP 115/70   Pulse (!) 50   Temp (!) 96.3 F (35.7 C) (Temporal)   Resp 16   Ht '5\' 11"'$  (1.803 m)   Wt 79.4 kg   SpO2 100%   BMI 24.41 kg/m  General:   Alert,  pleasant and cooperative in NAD Head:  Normocephalic and atraumatic. Neck:  Supple; no masses or thyromegaly. Lungs:  Clear throughout to auscultation.    Heart:  Regular rate and rhythm. Abdomen:  Soft, nontender and nondistended. Normal bowel sounds, without guarding, and without rebound.   Neurologic:  Alert and  oriented x4;  grossly normal neurologically.  Impression/Plan: Andrew Vazquez is here for an colonoscopy to be performed for family history in his father at 9 years old  Risks, benefits, limitations, and alternatives regarding  colonoscopy have been reviewed with the patient.  Questions have been answered.  All parties agreeable.   Lucilla Lame, MD  08/30/2021, 8:01 AM

## 2021-08-30 NOTE — Anesthesia Postprocedure Evaluation (Signed)
Anesthesia Post Note  Patient: Andrew Vazquez  Procedure(s) Performed: COLONOSCOPY WITH PROPOFOL  Patient location during evaluation: Endoscopy Anesthesia Type: General Level of consciousness: awake and alert Pain management: pain level controlled Vital Signs Assessment: post-procedure vital signs reviewed and stable Respiratory status: spontaneous breathing, nonlabored ventilation, respiratory function stable and patient connected to nasal cannula oxygen Cardiovascular status: blood pressure returned to baseline and stable Postop Assessment: no apparent nausea or vomiting Anesthetic complications: no   No notable events documented.   Last Vitals:  Vitals:   08/30/21 0732 08/30/21 0826  BP: 115/70 (!) 86/54  Pulse: (!) 50 (!) 51  Resp: 16 11  Temp: (!) 35.7 C   SpO2: 100% 98%    Last Pain:  Vitals:   08/30/21 0826  TempSrc:   PainSc: Asleep                 Precious Haws Emslee Lopezmartinez

## 2021-08-30 NOTE — Anesthesia Procedure Notes (Signed)
Procedure Name: General with mask airway Date/Time: 08/30/2021 8:13 AM  Performed by: Kelton Pillar, CRNAPre-anesthesia Checklist: Patient identified, Emergency Drugs available, Suction available and Patient being monitored Patient Re-evaluated:Patient Re-evaluated prior to induction Oxygen Delivery Method: Simple face mask Induction Type: IV induction Placement Confirmation: positive ETCO2, CO2 detector and breath sounds checked- equal and bilateral Dental Injury: Teeth and Oropharynx as per pre-operative assessment

## 2021-08-30 NOTE — Op Note (Signed)
[facility] Gastroenterology Patient Name: Andrew Vazquez Procedure Date: 08/30/2021 8:05 AM MRN: [id number] Account #: [id number] Date of Birth: [birth year] Admit Type: Outpatient Age: 46 Room: [facility] ENDO ROOM 4 Gender: Male Note Status: Finalized Instrument Name: Park Meo [id number] Procedure:             Colonoscopy Indications:           Screening for colorectal malignant neoplasm, Family                         history of colon cancer in a first-degree relative                         before age 57 years Providers:             Lucilla Lame MD, MD Referring MD:          Lucilla Lame MD, MD (Referring MD), Donnamarie Rossetti (Referring MD) Medicines:             Propofol per Anesthesia Complications:         No immediate complications. Procedure:             Pre-Anesthesia Assessment:                        - Prior to the procedure, a History and Physical was                         performed, and patient medications and allergies were                         reviewed. The patient's tolerance of previous                         anesthesia was also reviewed. The risks and benefits                         of the procedure and the sedation options and risks                         were discussed with the patient. All questions were                         answered, and informed consent was obtained. Prior                         Anticoagulants: The patient has taken no previous                         anticoagulant or antiplatelet agents. ASA Grade                         Assessment: II - A patient with mild systemic disease.                         After reviewing the risks and benefits, the patient  was deemed in satisfactory condition to undergo the                         procedure.                        After obtaining informed consent, the colonoscope was                         passed under direct vision.  Throughout the procedure,                         the patient's blood pressure, pulse, and oxygen                         saturations were monitored continuously. The                         Colonoscope was introduced through the anus and                         advanced to the the cecum, identified by appendiceal                         orifice and ileocecal valve. The colonoscopy was                         performed without difficulty. The patient tolerated                         the procedure well. The quality of the bowel                         preparation was excellent. Findings:      The perianal and digital rectal examinations were normal.      A 8 mm polyp was found in the ascending colon. The polyp was sessile.       The polyp was removed with a cold snare. Resection and retrieval were       complete.      A 4 mm polyp was found in the rectum. The polyp was sessile. The polyp       was removed with a cold snare. Resection and retrieval were complete. Impression:            - One 8 mm polyp in the ascending colon, removed with                         a cold snare. Resected and retrieved.                        - One 4 mm polyp in the rectum, removed with a cold                         snare. Resected and retrieved. Recommendation:        - Discharge patient to home.                        - Resume previous diet.                        -  Continue present medications.                        - Await pathology results.                        - Repeat colonoscopy in 5 years for surveillance. Procedure Code(s):     --- Professional ---                        [phone], Colonoscopy, flexible; with removal of                         tumor(s), polyp(s), or other lesion(s) by snare                         technique Diagnosis Code(s):     --- Professional ---                        Z12.11, Encounter for screening for malignant neoplasm                         of colon                         K63.5, Polyp of colon CPT copyright 2019 American Medical Association. All rights reserved. The codes documented in this report are preliminary and upon coder review may  be revised to meet current compliance requirements. Lucilla Lame MD, MD 08/30/2021 8:24:48 AM This report has been signed electronically. Number of Addenda: 0 Note Initiated On: 08/30/2021 8:05 AM Scope Withdrawal Time: 0 hours 8 minutes 12 seconds  Total Procedure Duration: 0 hours 12 minutes 43 seconds  Estimated Blood Loss:  Estimated blood loss: none.      [facility]

## 2021-08-30 NOTE — Transfer of Care (Signed)
Immediate Anesthesia Transfer of Care Note  Patient: Andrew Vazquez  Procedure(s) Performed: COLONOSCOPY WITH PROPOFOL  Patient Location: Endoscopy Unit  Anesthesia Type:General  Level of Consciousness: awake, drowsy and patient cooperative  Airway & Oxygen Therapy: Patient Spontanous Breathing and Patient connected to face mask oxygen  Post-op Assessment: Report given to RN and Post -op Vital signs reviewed and stable  Post vital signs: Reviewed and stable  Last Vitals:  Vitals Value Taken Time  BP 86/54 08/30/21 0826  Temp    Pulse 45 08/30/21 0832  Resp 13 08/30/21 0826  SpO2 100 % 08/30/21 0832  Vitals shown include unvalidated device data.  Last Pain:  Vitals:   08/30/21 0826  TempSrc:   PainSc: Asleep         Complications: No notable events documented.

## 2021-08-30 NOTE — Anesthesia Preprocedure Evaluation (Signed)
Anesthesia Evaluation  Patient identified by MRN, date of birth, ID band Patient awake    Reviewed: Allergy & Precautions, NPO status , Patient's Chart, lab work & pertinent test results  History of Anesthesia Complications Negative for: history of anesthetic complications  Airway Mallampati: III  TM Distance: >3 FB Neck ROM: full    Dental  (+) Chipped   Pulmonary neg shortness of breath, Current Smoker and Patient abstained from smoking.,    Pulmonary exam normal        Cardiovascular Exercise Tolerance: Good (-) angina(-) Past MI negative cardio ROS Normal cardiovascular exam     Neuro/Psych PSYCHIATRIC DISORDERS negative neurological ROS     GI/Hepatic negative GI ROS, Neg liver ROS, neg GERD  ,  Endo/Other  negative endocrine ROS  Renal/GU Renal disease  negative genitourinary   Musculoskeletal   Abdominal   Peds  Hematology negative hematology ROS (+)   Anesthesia Other Findings Past Medical History: No date: Depression No date: Hernia, inguinal, unilateral No date: Kidney stones No date: Renal disorder No date: Wears contact lenses  Past Surgical History: 06/22/2016: COLONOSCOPY WITH PROPOFOL; N/A     Comment:  Procedure: COLONOSCOPY WITH PROPOFOL;  Surgeon: Lucilla Lame, MD;  Location: [place];  Service:               Endoscopy;  Laterality: N/A; 02/08/2011: LAPAROSCOPIC INGUINAL HERNIA REPAIR; Bilateral     Comment:  Dr. Burt Knack, - [facility] No date: WISDOM TOOTH EXTRACTION     Reproductive/Obstetrics negative OB ROS                             Anesthesia Physical Anesthesia Plan  ASA: 2  Anesthesia Plan: General   Post-op Pain Management:    Induction: Intravenous  PONV Risk Score and Plan: Propofol infusion and TIVA  Airway Management Planned: Natural Airway and Nasal Cannula  Additional Equipment:   Intra-op Plan:   Post-operative  Plan:   Informed Consent: I have reviewed the patients History and Physical, chart, labs and discussed the procedure including the risks, benefits and alternatives for the proposed anesthesia with the patient or authorized representative who has indicated his/her understanding and acceptance.     Dental Advisory Given  Plan Discussed with: Anesthesiologist, CRNA and Surgeon  Anesthesia Plan Comments: (Patient consented for risks of anesthesia including but not limited to:  - adverse reactions to medications - risk of airway placement if required - damage to eyes, teeth, lips or other oral mucosa - nerve damage due to positioning  - sore throat or hoarseness - Damage to heart, brain, nerves, lungs, other parts of body or loss of life  Patient voiced understanding.)        Anesthesia Quick Evaluation

## 2021-08-31 ENCOUNTER — Encounter: Payer: Self-pay | Admitting: Gastroenterology

## 2021-08-31 LAB — SURGICAL PATHOLOGY

## 2021-09-01 ENCOUNTER — Encounter: Payer: Self-pay | Admitting: Gastroenterology

## 2022-05-21 IMAGING — CT CT ABD-PELV W/ CM
2 of 5 series · 14 of 46 positions shown, 16 images · IV contrast (agent unspecified)
Comparison: 10/27/2014

CLINICAL DATA: Right lower quadrant pain

EXAM:
CT ABDOMEN AND PELVIS WITH CONTRAST
TECHNIQUE: Multidetector CT imaging of the abdomen and pelvis was performed
using the standard protocol following bolus administration of
intravenous contrast.

[Series 2: abd pelvis 5.00 · axial · 0.78mm/px · z∈[-1587,-1187]mm · 11 of 97 slices shown, 13 images]
[im 9/97  soft-tissue]
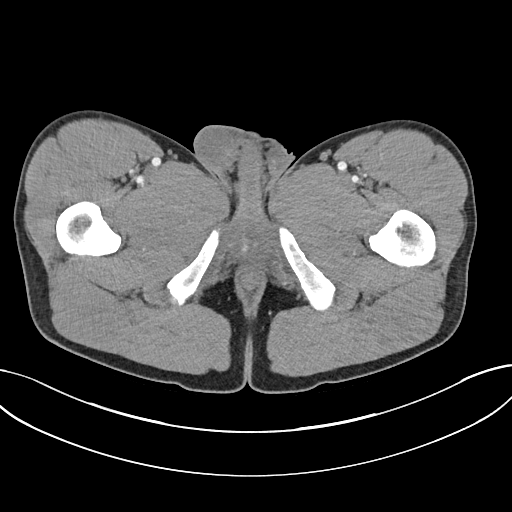
[im 9/97  bone]
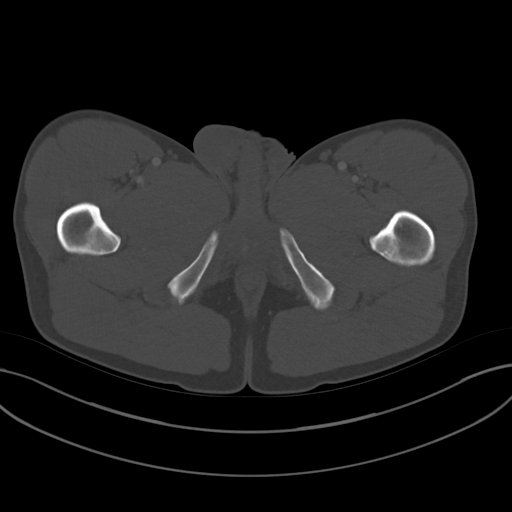
[im 17/97  soft-tissue]
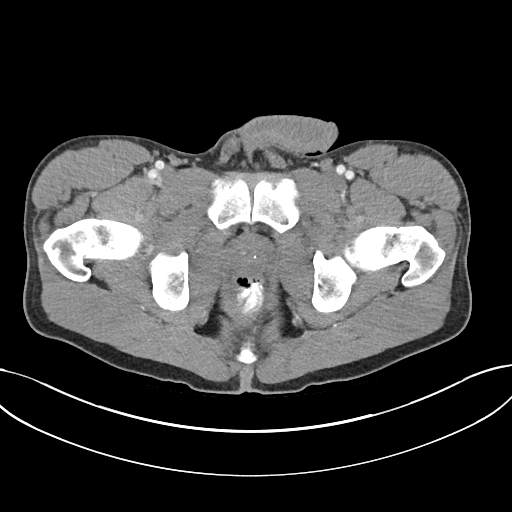
[im 25/97  soft-tissue]
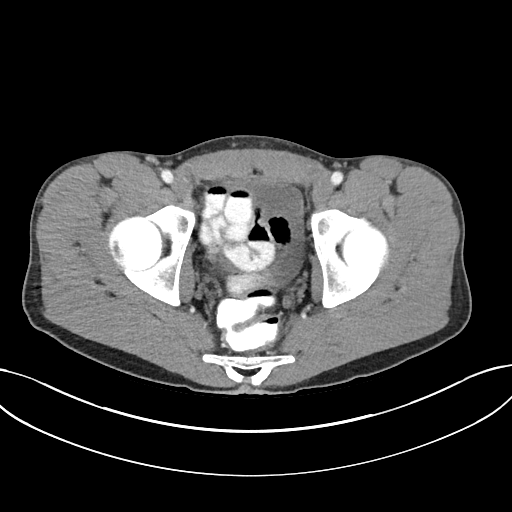
[im 33/97  soft-tissue]
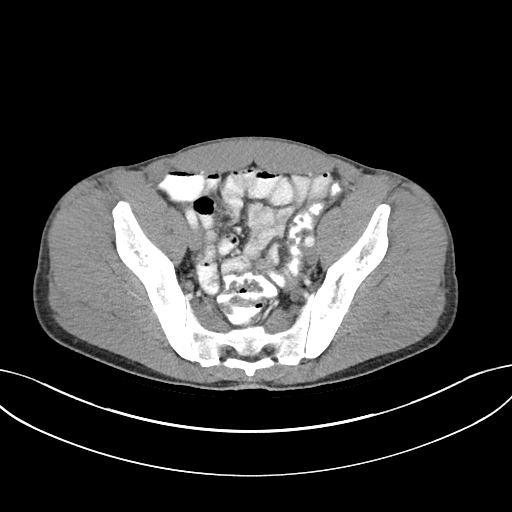
[im 41/97  soft-tissue]
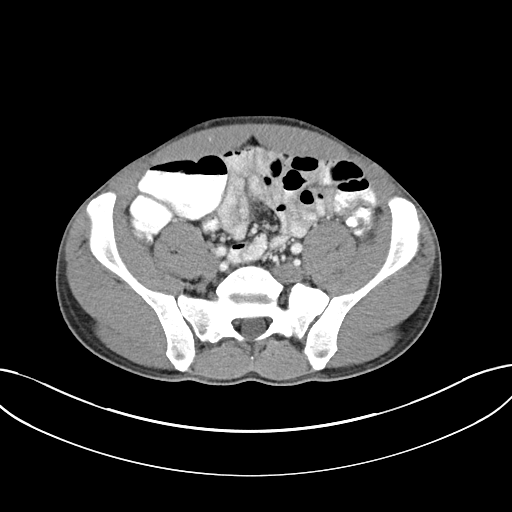
[im 49/97  soft-tissue]
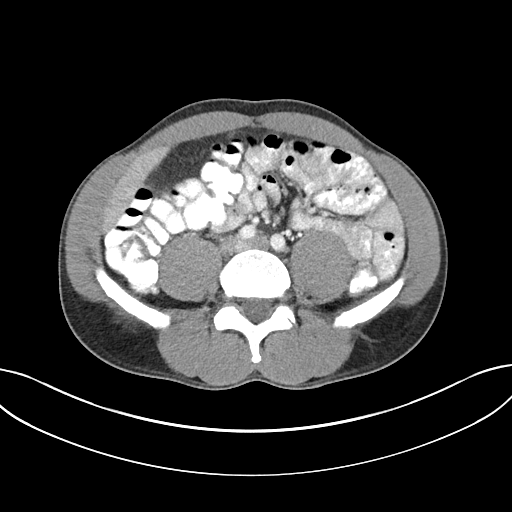
[im 57/97  soft-tissue]
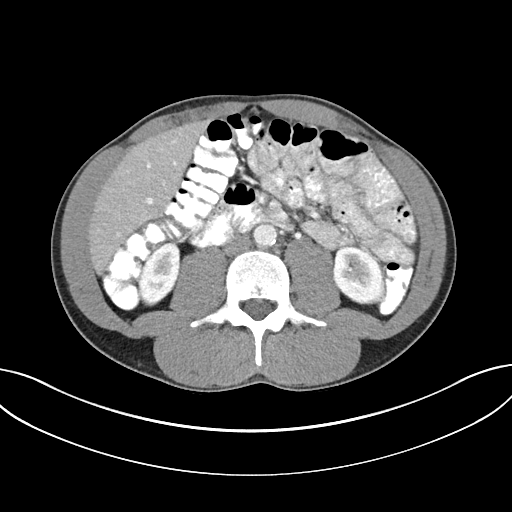
[im 65/97  soft-tissue]
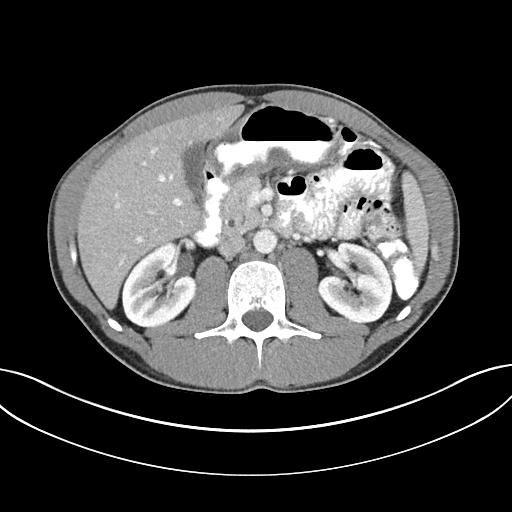
[im 73/97  soft-tissue]
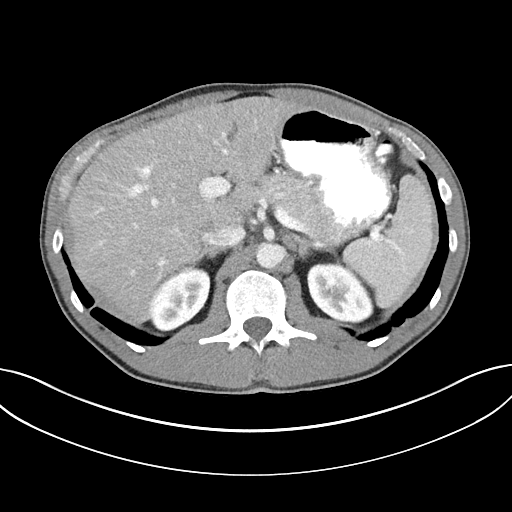
[im 73/97  bone]
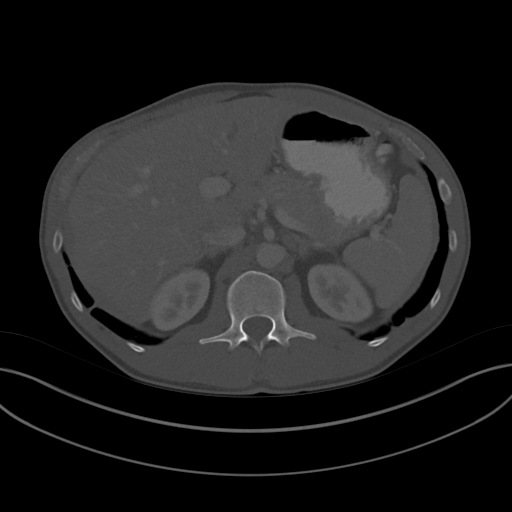
[im 81/97  soft-tissue]
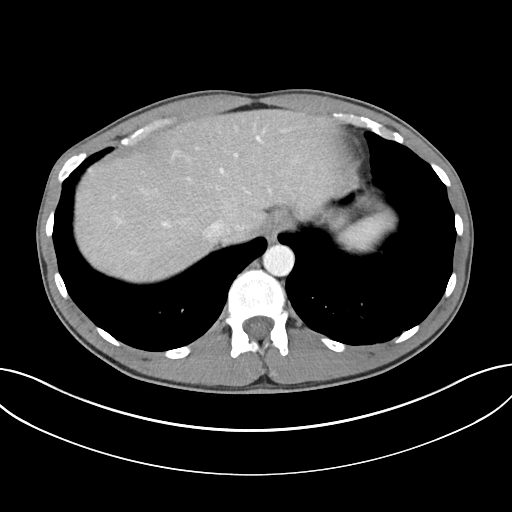
[im 89/97  soft-tissue]
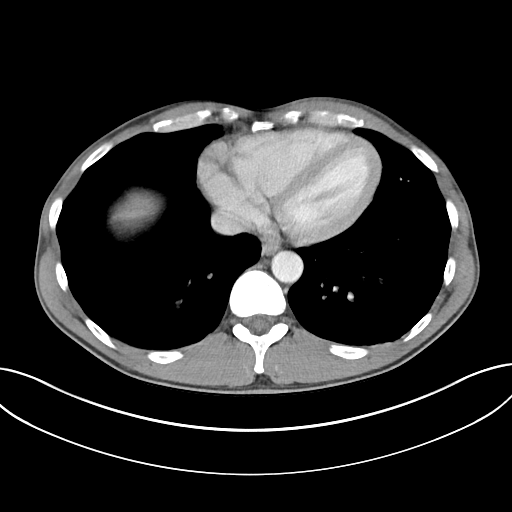

[Series 4: coronals abd pelvis 2.00 cor · coronal · 0.74mm/px · 3 of 126 slices shown]
[im 42/126  soft-tissue]
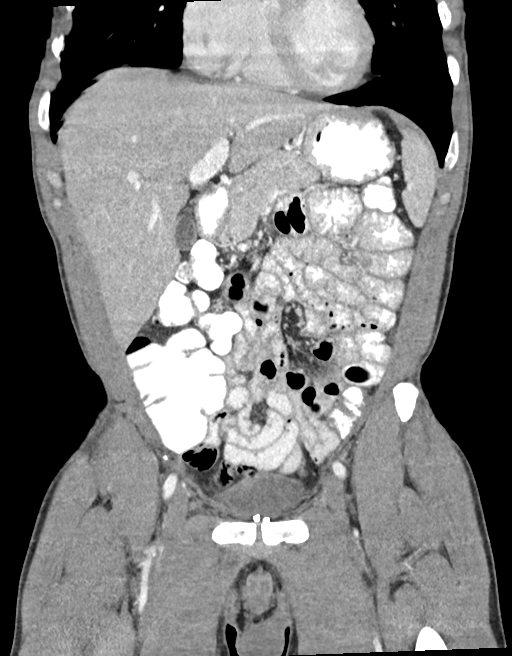
[im 56/126  soft-tissue]
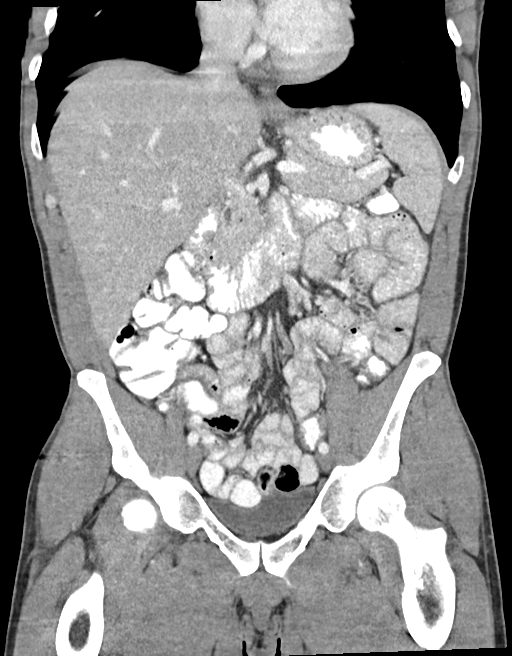
[im 70/126  soft-tissue]
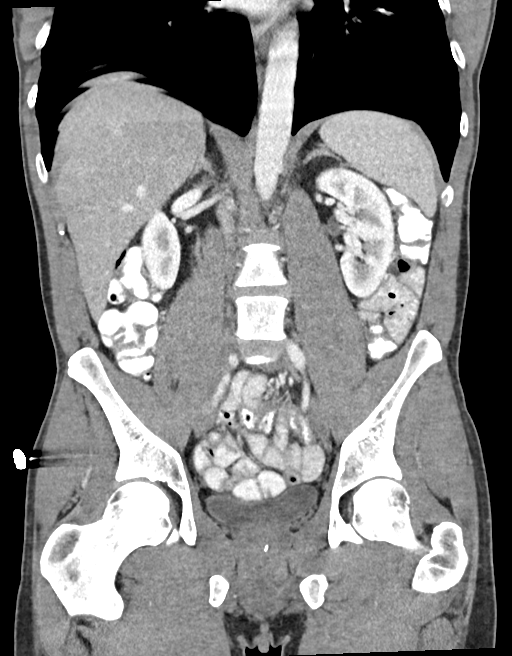

[14 of 46 positions shown; findings below may reference images not displayed]

RADIATION DOSE REDUCTION: This exam was performed according to the
departmental dose-optimization program which includes automated
exposure control, adjustment of the mA and/or kV according to
patient size and/or use of iterative reconstruction technique.

CONTRAST:  100mL OMNIPAQUE IOHEXOL 300 MG/ML SOLN additional oral
enteric contrast
FINDINGS: Lower chest: No acute abnormality.

Hepatobiliary: No solid liver abnormality is seen. No gallstones,
gallbladder wall thickening, or biliary dilatation.

Pancreas: Unremarkable. No pancreatic ductal dilatation or
surrounding inflammatory changes.

Spleen: Normal in size without significant abnormality.

Adrenals/Urinary Tract: Adrenal glands are unremarkable. Kidneys are
normal, without renal calculi, solid lesion, or hydronephrosis.
Bladder is unremarkable.

Stomach/Bowel: Stomach is within normal limits. Appendix appears
normal. No evidence of bowel wall thickening, distention, or
inflammatory changes.

Vascular/Lymphatic: Aortic atherosclerosis. No enlarged abdominal or
pelvic lymph nodes.

Reproductive: No mass or other significant abnormality.

Other: Status post bilateral inguinal hernia repair. No evidence of
recurrent hernia. No ascites.

Musculoskeletal: No acute or significant osseous findings.
IMPRESSION: 1. No acute CT findings of the abdomen or pelvis to explain right
lower quadrant pain. Normal appendix.
2. Status post bilateral inguinal hernia repair. No evidence of
recurrent hernia.

Aortic Atherosclerosis ([id number]).

## 2022-05-30 ENCOUNTER — Other Ambulatory Visit: Payer: Self-pay | Admitting: Physician Assistant

## 2022-05-30 ENCOUNTER — Ambulatory Visit
Admission: RE | Admit: 2022-05-30 | Discharge: 2022-05-30 | Disposition: A | Discharge status: Home / Self Care | Payer: 59 | Source: Ambulatory Visit | Attending: Physician Assistant | Admitting: Physician Assistant

## 2022-05-30 DIAGNOSIS — M5136 Other intervertebral disc degeneration, lumbar region: Secondary | ICD-10-CM
